# Patient Record
Sex: Female | Born: 1960
Health system: Southern US, Community
[De-identification: ages and names within clinical notes are randomized; demographics above are authoritative.]

## PROBLEM LIST (undated history)

## (undated) ENCOUNTER — Ambulatory Visit: Payer: BC Managed Care – PPO

## (undated) DIAGNOSIS — F419 Anxiety disorder, unspecified: Secondary | ICD-10-CM

## (undated) DIAGNOSIS — E785 Hyperlipidemia, unspecified: Secondary | ICD-10-CM

## (undated) DIAGNOSIS — I10 Essential (primary) hypertension: Secondary | ICD-10-CM

## (undated) DIAGNOSIS — M79643 Pain in unspecified hand: Secondary | ICD-10-CM

## (undated) DIAGNOSIS — E119 Type 2 diabetes mellitus without complications: Secondary | ICD-10-CM

## (undated) HISTORY — DX: Type 2 diabetes mellitus without complications: E11.9

## (undated) HISTORY — PX: TUBAL LIGATION: SHX77

## (undated) HISTORY — PX: TONSILLECTOMY: SUR1361

## (undated) HISTORY — PX: CYST EXCISION: SHX5701

## (undated) HISTORY — PX: LIPOMA EXCISION: SHX5283

---

## 2000-02-18 ENCOUNTER — Ambulatory Visit (HOSPITAL_COMMUNITY): Admission: RE | Admit: 2000-02-18 | Discharge: 2000-02-18 | Payer: Self-pay | Admitting: Obstetrics and Gynecology

## 2000-06-11 ENCOUNTER — Other Ambulatory Visit: Admission: RE | Admit: 2000-06-11 | Discharge: 2000-06-11 | Payer: Self-pay | Admitting: Obstetrics and Gynecology

## 2000-09-22 ENCOUNTER — Emergency Department (HOSPITAL_COMMUNITY): Admission: EM | Admit: 2000-09-22 | Discharge: 2000-09-22 | Payer: Self-pay | Admitting: Emergency Medicine

## 2000-09-22 ENCOUNTER — Encounter: Payer: Self-pay | Admitting: Emergency Medicine

## 2001-01-27 ENCOUNTER — Ambulatory Visit (HOSPITAL_COMMUNITY): Admission: RE | Admit: 2001-01-27 | Discharge: 2001-01-27 | Payer: Self-pay | Admitting: Family Medicine

## 2001-01-27 ENCOUNTER — Encounter: Payer: Self-pay | Admitting: Family Medicine

## 2001-01-29 ENCOUNTER — Ambulatory Visit (HOSPITAL_COMMUNITY): Admission: RE | Admit: 2001-01-29 | Discharge: 2001-01-29 | Payer: Self-pay | Admitting: Family Medicine

## 2001-01-29 ENCOUNTER — Encounter: Payer: Self-pay | Admitting: Family Medicine

## 2001-03-15 ENCOUNTER — Ambulatory Visit (HOSPITAL_COMMUNITY): Admission: RE | Admit: 2001-03-15 | Discharge: 2001-03-15 | Payer: Self-pay | Admitting: Thoracic Surgery

## 2001-03-15 ENCOUNTER — Encounter: Payer: Self-pay | Admitting: Thoracic Surgery

## 2001-03-17 HISTORY — PX: LUNG SURGERY: SHX703

## 2001-03-24 ENCOUNTER — Encounter: Payer: Self-pay | Admitting: Thoracic Surgery

## 2001-03-26 ENCOUNTER — Encounter: Payer: Self-pay | Admitting: Thoracic Surgery

## 2001-03-26 ENCOUNTER — Inpatient Hospital Stay (HOSPITAL_COMMUNITY): Admission: RE | Admit: 2001-03-26 | Discharge: 2001-03-30 | Payer: Self-pay | Admitting: Thoracic Surgery

## 2001-03-27 ENCOUNTER — Encounter: Payer: Self-pay | Admitting: Thoracic Surgery

## 2001-03-28 ENCOUNTER — Encounter: Payer: Self-pay | Admitting: Thoracic Surgery

## 2001-03-29 ENCOUNTER — Encounter: Payer: Self-pay | Admitting: Thoracic Surgery

## 2001-03-30 ENCOUNTER — Encounter: Payer: Self-pay | Admitting: Thoracic Surgery

## 2001-04-07 ENCOUNTER — Encounter: Payer: Self-pay | Admitting: Thoracic Surgery

## 2001-04-07 ENCOUNTER — Encounter: Admission: RE | Admit: 2001-04-07 | Discharge: 2001-04-07 | Payer: Self-pay | Admitting: Thoracic Surgery

## 2001-04-21 ENCOUNTER — Encounter: Payer: Self-pay | Admitting: Thoracic Surgery

## 2001-04-21 ENCOUNTER — Encounter: Admission: RE | Admit: 2001-04-21 | Discharge: 2001-04-21 | Payer: Self-pay | Admitting: Thoracic Surgery

## 2001-05-12 ENCOUNTER — Encounter: Admission: RE | Admit: 2001-05-12 | Discharge: 2001-05-12 | Payer: Self-pay | Admitting: Thoracic Surgery

## 2001-05-12 ENCOUNTER — Encounter: Payer: Self-pay | Admitting: Thoracic Surgery

## 2001-08-10 ENCOUNTER — Encounter: Payer: Self-pay | Admitting: Family Medicine

## 2001-08-10 ENCOUNTER — Ambulatory Visit (HOSPITAL_COMMUNITY): Admission: RE | Admit: 2001-08-10 | Discharge: 2001-08-10 | Payer: Self-pay | Admitting: Specialist

## 2001-08-11 ENCOUNTER — Encounter: Admission: RE | Admit: 2001-08-11 | Discharge: 2001-08-11 | Payer: Self-pay | Admitting: Thoracic Surgery

## 2001-08-11 ENCOUNTER — Encounter: Payer: Self-pay | Admitting: Thoracic Surgery

## 2001-11-10 ENCOUNTER — Encounter: Admission: RE | Admit: 2001-11-10 | Discharge: 2001-11-10 | Payer: Self-pay | Admitting: Thoracic Surgery

## 2001-11-10 ENCOUNTER — Encounter: Payer: Self-pay | Admitting: Thoracic Surgery

## 2004-09-19 ENCOUNTER — Encounter: Admission: RE | Admit: 2004-09-19 | Discharge: 2004-09-19 | Payer: Self-pay | Admitting: General Surgery

## 2015-05-02 ENCOUNTER — Ambulatory Visit
Admission: RE | Admit: 2015-05-02 | Discharge: 2015-05-02 | Disposition: A | Discharge status: Home / Self Care | Payer: BLUE CROSS/BLUE SHIELD | Source: Ambulatory Visit | Attending: Family Medicine | Admitting: Family Medicine

## 2015-05-02 ENCOUNTER — Other Ambulatory Visit: Payer: Self-pay | Admitting: Family Medicine

## 2015-05-02 DIAGNOSIS — M5432 Sciatica, left side: Secondary | ICD-10-CM

## 2015-11-08 ENCOUNTER — Other Ambulatory Visit: Payer: Self-pay | Admitting: Orthopedic Surgery

## 2015-11-08 DIAGNOSIS — M533 Sacrococcygeal disorders, not elsewhere classified: Secondary | ICD-10-CM

## 2016-11-06 ENCOUNTER — Other Ambulatory Visit: Payer: Self-pay | Admitting: Orthopedic Surgery

## 2016-11-06 DIAGNOSIS — M533 Sacrococcygeal disorders, not elsewhere classified: Secondary | ICD-10-CM

## 2016-11-27 ENCOUNTER — Telehealth: Payer: Self-pay | Admitting: *Deleted

## 2016-11-27 NOTE — Telephone Encounter (Signed)
Contacted Seth Bake at [place] office and left message on her machine. Left regarding the new patient appt for Septmebr 19th at 11:45am.

## 2016-12-01 ENCOUNTER — Other Ambulatory Visit: Payer: BLUE CROSS/BLUE SHIELD

## 2016-12-02 ENCOUNTER — Telehealth: Payer: Self-pay | Admitting: *Deleted

## 2016-12-02 ENCOUNTER — Other Ambulatory Visit: Payer: BLUE CROSS/BLUE SHIELD

## 2016-12-02 NOTE — Telephone Encounter (Signed)
Patient called and rescheduled her appt for later in the day tomorrow.

## 2016-12-03 ENCOUNTER — Encounter: Payer: Self-pay | Admitting: Gynecologic Oncology

## 2016-12-03 ENCOUNTER — Ambulatory Visit: Payer: BLUE CROSS/BLUE SHIELD | Attending: Gynecologic Oncology | Admitting: Gynecologic Oncology

## 2016-12-03 ENCOUNTER — Ambulatory Visit: Payer: BLUE CROSS/BLUE SHIELD | Admitting: Gynecologic Oncology

## 2016-12-03 VITALS — BP 142/76 | HR 74 | Temp 97.7°F | Resp 18 | Ht 65.0 in | Wt 233.0 lb

## 2016-12-03 DIAGNOSIS — Z8249 Family history of ischemic heart disease and other diseases of the circulatory system: Secondary | ICD-10-CM | POA: Insufficient documentation

## 2016-12-03 DIAGNOSIS — Z888 Allergy status to other drugs, medicaments and biological substances status: Secondary | ICD-10-CM | POA: Diagnosis not present

## 2016-12-03 DIAGNOSIS — Z79899 Other long term (current) drug therapy: Secondary | ICD-10-CM | POA: Insufficient documentation

## 2016-12-03 DIAGNOSIS — E119 Type 2 diabetes mellitus without complications: Secondary | ICD-10-CM | POA: Insufficient documentation

## 2016-12-03 DIAGNOSIS — N950 Postmenopausal bleeding: Secondary | ICD-10-CM | POA: Diagnosis not present

## 2016-12-03 DIAGNOSIS — E669 Obesity, unspecified: Secondary | ICD-10-CM | POA: Diagnosis not present

## 2016-12-03 DIAGNOSIS — Z992 Dependence on renal dialysis: Secondary | ICD-10-CM | POA: Insufficient documentation

## 2016-12-03 DIAGNOSIS — D259 Leiomyoma of uterus, unspecified: Secondary | ICD-10-CM | POA: Diagnosis not present

## 2016-12-03 DIAGNOSIS — C541 Malignant neoplasm of endometrium: Secondary | ICD-10-CM

## 2016-12-03 DIAGNOSIS — N930 Postcoital and contact bleeding: Secondary | ICD-10-CM | POA: Insufficient documentation

## 2016-12-03 DIAGNOSIS — Z9889 Other specified postprocedural states: Secondary | ICD-10-CM | POA: Insufficient documentation

## 2016-12-03 DIAGNOSIS — Z791 Long term (current) use of non-steroidal anti-inflammatories (NSAID): Secondary | ICD-10-CM | POA: Insufficient documentation

## 2016-12-03 DIAGNOSIS — Z87891 Personal history of nicotine dependence: Secondary | ICD-10-CM | POA: Insufficient documentation

## 2016-12-03 DIAGNOSIS — Z880 Allergy status to penicillin: Secondary | ICD-10-CM | POA: Insufficient documentation

## 2016-12-03 DIAGNOSIS — Z833 Family history of diabetes mellitus: Secondary | ICD-10-CM | POA: Diagnosis not present

## 2016-12-03 NOTE — Patient Instructions (Addendum)
Preparing for your Surgery  Plan for surgery on December 16, 2016 with Dr. Everitt Amber at [facility] will be scheduled for a robotic assisted total hysterectomy, bilateral salpingo-oophorectomy, sentinel lymph node biopsy.  Pre-operative Testing -You will receive a phone call from presurgical testing at [facility] to arrange for a pre-operative testing appointment before your surgery.  This appointment normally occurs one to two weeks before your scheduled surgery.   -Bring your insurance card, copy of an advanced directive if applicable, medication list  -At that visit, you will be asked to sign a consent for a possible blood transfusion in case a transfusion becomes necessary during surgery.  The need for a blood transfusion is rare but having consent is a necessary part of your care.     -You should not be taking blood thinners or aspirin at least ten days prior to surgery unless instructed by your surgeon.  Day Before Surgery at [place] will be asked to take in a light diet the day before surgery.  Avoid carbonated beverages.  You will be advised to have nothing to eat or drink after midnight the evening before.    Eat a light diet the day before surgery.  Examples including soups, broths, toast, yogurt, mashed potatoes.  Things to avoid include carbonated beverages (fizzy beverages), raw fruits and raw vegetables, or beans.   If your bowels are filled with gas, your surgeon will have difficulty visualizing your pelvic organs which increases your surgical risks.  Your role in recovery Your role is to become active as soon as directed by your doctor, while still giving yourself time to heal.  Rest when you feel tired. You will be asked to do the following in order to speed your recovery:  - Cough and breathe deeply. This helps toclear and expand your lungs and can prevent pneumonia. You may be given a spirometer to practice deep breathing. A staff  member will show you how to use the spirometer. - Do mild physical activity. Walking or moving your legs help your circulation and body functions return to normal. A staff member will help you when you try to walk and will provide you with simple exercises. Do not try to get up or walk alone the first time. - Actively manage your pain. Managing your pain lets you move in comfort. We will ask you to rate your pain on a scale of zero to 10. It is your responsibility to tell your doctor or nurse where and how much you hurt so your pain can be treated.  Special Considerations -If you are diabetic, you may be placed on insulin after surgery to have closer control over your blood sugars to promote healing and recovery.  This does not mean that you will be discharged on insulin.  If applicable, your oral antidiabetics will be resumed when you are tolerating a solid diet.  -Your final pathology results from surgery should be available by the Friday after surgery and the results will be relayed to you when available.  -Dr. Lahoma Crocker is the Surgeon that assists your GYN Oncologist with surgery.  The next day after your surgery you will either see your GYN Oncologist or Dr. Lahoma Crocker.   Blood Transfusion Information WHAT IS A BLOOD TRANSFUSION? A transfusion is the replacement of blood or some of its parts. Blood is made up of multiple cells which provide different functions.  Red blood cells carry oxygen and are used for blood  loss replacement.  White blood cells fight against infection.  Platelets control bleeding.  Plasma helps clot blood.  Other blood products are available for specialized needs, such as hemophilia or other clotting disorders. BEFORE THE TRANSFUSION  Who gives blood for transfusions?   You may be able to donate blood to be used at a later date on yourself (autologous donation).  Relatives can be asked to donate blood. This is generally not any safer than if  you have received blood from a stranger. The same precautions are taken to ensure safety when a relative's blood is donated.  Healthy volunteers who are fully evaluated to make sure their blood is safe. This is blood bank blood. Transfusion therapy is the safest it has ever been in the practice of medicine. Before blood is taken from a donor, a complete history is taken to make sure that person has no history of diseases nor engages in risky social behavior (examples are intravenous drug use or sexual activity with multiple partners). The donor's travel history is screened to minimize risk of transmitting infections, such as malaria. The donated blood is tested for signs of infectious diseases, such as HIV and hepatitis. The blood is then tested to be sure it is compatible with you in order to minimize the chance of a transfusion reaction. If you or a relative donates blood, this is often done in anticipation of surgery and is not appropriate for emergency situations. It takes many days to process the donated blood. RISKS AND COMPLICATIONS Although transfusion therapy is very safe and saves many lives, the main dangers of transfusion include:   Getting an infectious disease.  Developing a transfusion reaction. This is an allergic reaction to something in the blood you were given. Every precaution is taken to prevent this. The decision to have a blood transfusion has been considered carefully by your caregiver before blood is given. Blood is not given unless the benefits outweigh the risks.  Total Laparoscopic Hysterectomy, Care After Refer to this sheet in the next few weeks. These instructions provide you with information on caring for yourself after your procedure. Your health care provider may also give you more specific instructions. Your treatment has been planned according to current medical practices, but problems sometimes occur. Call your health care provider if you have any problems or  questions after your procedure. What can I expect after the procedure?  Pain and bruising at the incision sites. You will be given pain medicine to control it.  Menopausal symptoms such as hot flashes, night sweats, and insomnia if your ovaries were removed.  Sore throat from the breathing tube that was inserted during surgery. Follow these instructions at home:  Only take over-the-counter or prescription medicines for pain, discomfort, or fever as directed by your health care provider.  Do not take aspirin. It can cause bleeding.  Do not drive when taking pain medicine.  Follow your health care provider's advice regarding diet, exercise, lifting, driving, and general activities.  Resume your usual diet as directed and allowed.  Get plenty of rest and sleep.  Do not douche, use tampons, or have sexual intercourse for at least 6 weeks, or until your health care provider gives you permission.  Change your bandages (dressings) as directed by your health care provider.  Monitor your temperature and notify your health care provider of a fever.  Take showers instead of baths for 2-3 weeks.  Do not drink alcohol until your health care provider gives you permission.  If you develop constipation, you may take a mild laxative with your health care provider's permission. Bran foods may help with constipation problems. Drinking enough fluids to keep your urine clear or pale yellow may help as well.  Try to have someone home with you for 1-2 weeks to help around the house.  Keep all of your follow-up appointments as directed by your health care provider. Contact a health care provider if:  You have swelling, redness, or increasing pain around your incision sites.  You have pus coming from your incision.  You notice a bad smell coming from your incision.  Your incision breaks open.  You feel dizzy or lightheaded.  You have pain or bleeding when you urinate.  You have persistent  diarrhea.  You have persistent nausea and vomiting.  You have abnormal vaginal discharge.  You have a rash.  You have any type of abnormal reaction or develop an allergy to your medicine.  You have poor pain control with your prescribed medicine. Get help right away if:  You have chest pain or shortness of breath.  You have severe abdominal pain that is not relieved with pain medicine.  You have pain or swelling in your legs. This information is not intended to replace advice given to you by your health care provider. Make sure you discuss any questions you have with your health care provider. Document Released: 12/22/2012 Document Revised: 08/09/2015 Document Reviewed: 09/21/2012 Elsevier Interactive Patient Education  2017 [employer].

## 2016-12-03 NOTE — Progress Notes (Signed)
Consult Note: Gyn-Onc  Shelia Murray 56 y.o. female  CC:  Chief Complaint  Patient presents with  . Endometrial cancer [facility])    HPI: Patient is seen today in consultation at the request of Dr. Ronita Hipps. Primary physician Dr. Joetta Manners.  Patient is a 56 year old who presented for her routine GYN care and had a Pap smear that revealed atypical endometrial/glandular cells of undetermined significance. This then prompted the appropriate evaluation. She underwent a ultrasound on 11/21/2016. It revealed the uterus to be 7 x 4 x 4.3 cm. The endometrial stripe was 14.2 mm. There are multiple fibroids noted with the largest being subserosal at 1.3 x 1.1 cm. She subsequently underwent an endometrial biopsy on the same day as the ultrasound. It revealed a grade 1 endometrioid adenocarcinoma. She comes in today for evaluation of the above. She states that she has been menopausal since about 2014 2015. She has intermittently and sporadically had some postmenopausal bleeding. Occasionally she'll have some postcoital spotting. She never took any hormone replacement therapy. She never recalls having a prior abnormal Pap smear. She did have a D&C in her 55s but she is not sure what the indication for that was.  She's otherwise been in her usual state of health. She is up-to-date on all her routine health care maintenance. Her METs are greater than 4.  Review of Systems; Constitutional: She does complain of bloating. She has gained about 5 pounds in last month. She has lost 8 pounds early in the year and is frustrated by her weight gain. Skin: No rash Cardiovascular: No chest pain, shortness of breath, or edema  Pulmonary: No cough Gastro Intestinal: She does complain of abdominal bloating but no specific pain. Occasionally she's had some nausea which is improved with ginger. It is rare that she will have emesis. She does have some loose stools up to 3-4 per day. She believes this is related to medications.   Genitourinary: Vaginal bleeding as above  Current Meds:  Outpatient Encounter Prescriptions as of 12/03/2016  Medication Sig  . ibuprofen (ADVIL,MOTRIN) 800 MG tablet TK 1 T PO EVERY 8 HOURS PRN FOR PAIN WITH FOOD  . lisinopril (PRINIVIL,ZESTRIL) 20 MG tablet TK 1 T PO QAM  . methocarbamol (ROBAXIN) 500 MG tablet TK 1 T PO Q 6 H PRF SPASMS  . pravastatin (PRAVACHOL) 40 MG tablet TK 1 T PO ONCE A DAY  . SYNJARDY XR 12.07-998 MG TB24 TK 1 T PO BID WC  . TRULICITY 1.5 PP/5.0DT SOPN INJ 0.5 ML [place] ONCE A WEEK  . VESICARE 5 MG tablet TK 1 T PO QD   No facility-administered encounter medications on file as of 12/03/2016.     Allergy:  Allergies  Allergen Reactions  . Amoxicillin Nausea And Vomiting  . Ketamine     Social Hx:   Social History   Social History  . Marital status: Married    Spouse name: N/A  . Number of children: N/A  . Years of education: N/A   Occupational History  . Not on file.   Social History Main Topics  . Smoking status: Former Smoker    Packs/day: 0.25    Years: 20.00    Quit date: 12/03/2001  . Smokeless tobacco: Never Used  . Alcohol use Yes     Comment: Socially  . Drug use: No  . Sexual activity: Yes   Other Topics Concern  . Not on file   Social History Narrative  . No narrative on file  Past Surgical Hx:  Past Surgical History:  Procedure Laterality Date  . LUNG SURGERY Left 2003   Resection of lung nodules LLL    Past Medical Hx: History reviewed. No pertinent past medical history.  Oncology Hx:    Endometrial ca [facility])   11/21/2016 Initial Diagnosis    Endometrial ca (HCC)-grade 1 on endometrial biopsy       Family Hx:  Family History  Problem Relation Age of Onset  . Heart attack Mother   . Hypertension Mother   . Diabetes Maternal Aunt     Vitals:  Blood pressure (!) 142/76, pulse 74, temperature 97.7 F (36.5 C), temperature source Oral, resp. rate 18, height 5\' 5"  (1.651 m), weight 233 lb (105.7 kg), SpO2 99  %.  Physical Exam:  Well-nourished vulva female in no acute distress.  Neck: Supple, no lymphadenopathy, no thyromegaly.  Lungs: Clear to auscultation bilaterally.  Cardiac: Regular rate rhythm.  Abdomen: Obese, soft, nontender, nondistended. She has a well-healed Pfannenstiel skin incision.  Groins: No lymphadenopathy.  Extremity: No edema.  Pelvic: External genitalia within normal limits. Vagina slightly atrophic. The cervix is nulliparous. There's a physiologic discharge. There are no visible lesions. Bimanual examination is limited by habitus and voluntary guarding however the uterus does not appear to be appreciably enlarged. There are no adnexal masses. Rectal confirms.  Assessment/Plan: 56 year old with a clinical stage I grade 1 endometrioid adenocarcinoma. We discussed proceeding with a hysterectomy, bilateral salpingo-oophorectomy as treatment for her endometrial cancer. We reviewed the sentinel lymph node technique. Risks and benefits of sentinel lymph node biopsy were reviewed. We reviewed that we use ICG dye. The patient does not have an iodine allergy her known liver dysfunction. We will the false negative rate of 0.4% and that 3% of patients with metastatic disease will not have it detected by sentinel lymph node biopsy and endometrial cancer. We also discussed that there is a low rate of allergic reaction to the dye which is less than 1%. In the case of failed mapping which can occur up to 40% of the time, a bilateral lower unilateral lymphadenectomy will be performed. She'll benefits of sentinel lymph nodes including a higher detection rate for metastases due to ultra staging and a potential reduction of operative morbidity were discussed. We discussed the risk of lymphedema, nerve dysfunction and lymphocytic process which all potential risks of both the sentinel lymph node technique as well as with complete lymphadenectomy. Additional risks to the patient including the risk of  injury to surrounding organs, thromboembolic disease, infection and bleeding were discussed.  We discussed moving forward with robotic assisted hysterectomy, bilateral salpingo-oophorectomy, sentinel lymph node evaluation, and possible lymph node dissection. We also discussed possible laparotomy. As stated above, the risks of surgery were discussed including but not limited to injury to surrounding organs such as the bowel, bladder, blood vessels, ureters, and nerves. Bleeding which may require a blood transfusion. There is also risks associated with anesthesia. She was to clear understanding and wishes to move forward with surgery. She was given the surgical date of October 2. Initially she felt that that was too soon there has been encouraged her to have the surgery that day. That works for him as he is on hemodialysis and is on a Monday Wednesday Friday schedule.  Her questions as well as those of her husband were elicited in answer to their satisfaction. She had some confusion regarding our discussion of completion lymphadenectomy in the setting of her not mapping. We discussed this  such that she understood. I did discuss that if she did not map on one or both sides we will proceed with a hysterectomy and sent for frozen section before completion lymphadenectomy would be performed in the setting of deeply invasive disease. After showing her pictures and explained this again she understood. We discussed typical recovery in time out of work. She will have FMLA forms sent to [place].  We appreciate the opportunity to partner in the care of this very pleasant patient. Nancy Marus A., MD 12/03/2016, 2:48 PM

## 2016-12-05 ENCOUNTER — Telehealth: Payer: Self-pay | Admitting: Gynecologic Oncology

## 2016-12-05 NOTE — Telephone Encounter (Signed)
Left message for patient asking her to please call the office to discuss her questions about her upcoming surgery.

## 2016-12-09 NOTE — Patient Instructions (Signed)
Shelia Murray  12/09/2016   Your procedure is scheduled on: 12-16-16  Report to [facility]  Entrance Take [place]  elevators to 3rd floor to  [place] at 10:30AM.    Call this number if you have problems the morning of surgery [phone]    Remember: ONLY 1 PERSON MAY GO WITH YOU TO SHORT STAY TO GET  READY MORNING OF [employer].    Eat a light diet the day before surgery.  Examples including soups, broths, toast, yogurt, mashed potatoes.  Things to avoid include carbonated beverages (fizzy  beverages), raw fruits and raw vegetables, or beans.  If your bowels are filled with gas, your surgeon will have difficulty visualizing your pelvic organs  which increases your surgical risks. Do not eat food or drink liquids :After Midnight.     Take these medicines the morning of surgery with A SIP OF WATER: PRAVASTATIN(PRAVACHOL)                                You may not have any metal on your body including hair pins and              piercings  Do not wear jewelry, make-up, lotions, powders or perfumes, deodorant             Do not wear nail polish.  Do not shave  48 hours prior to surgery.             Do not bring valuables to the hospital. [place].  Contacts, dentures or bridgework may not be worn into surgery.  Leave suitcase in the car. After surgery it may be brought to your room.               Please read over the following fact sheets you were given: _____________________________________________________________________  How to Manage Your Diabetes Before and After Surgery  Why is it important to control my blood sugar before and after surgery? . Improving blood sugar levels before and after surgery helps healing and can limit problems. . A way of improving blood sugar control is eating a healthy diet by: o  Eating less sugar and carbohydrates o  Increasing activity/exercise o  Talking  with your doctor about reaching your blood sugar goals . High blood sugars (greater than 180 mg/dL) can raise your risk of infections and slow your recovery, so you will need to focus on controlling your diabetes during the weeks before surgery. . Make sure that the doctor who takes care of your diabetes knows about your planned surgery including the date and location.  How do I manage my blood sugar before surgery? . Check your blood sugar at least 4 times a day, starting 2 days before surgery, to make sure that the level is not too high or low. o Check your blood sugar the morning of your surgery when you wake up and every 2 hours until you get to the Short Stay unit. . If your blood sugar is less than 70 mg/dL, you will need to treat for low blood sugar: o Do not take insulin. o Treat a low blood sugar (less than 70 mg/dL) with  cup of clear juice (cranberry or apple), 4  glucose tablets, OR glucose gel. o Recheck blood sugar in 15 minutes after treatment (to make sure it is greater than 70 mg/dL). If your blood sugar is not greater than 70 mg/dL on recheck, call [phone] for further instructions. . Report your blood sugar to the short stay nurse when you get to Short Stay.  . If you are admitted to the hospital after surgery: o Your blood sugar will be checked by the staff and you will probably be given insulin after surgery (instead of oral diabetes medicines) to make sure you have good blood sugar levels. o The goal for blood sugar control after surgery is 80-180 mg/dL.   WHAT DO I DO ABOUT MY DIABETES MEDICATION?    Please ask your healthcare provider for instructions SYNJARDY diabetes medication management for surgery!!  . THE DAY BEFORE SURGERY, take  TRULICITY as normal       . THE MORNING OF SURGERY,  do not take other diabetes injectables, including Byetta (exenatide), Bydureon (exenatide ER), Victoza (liraglutide), or Trulicity (dulaglutide).   Patient  Signature:  Date:   Nurse Signature:  Date:   Reviewed and Endorsed by [facility] Patient Education Committee, August 2015    [facility] - Preparing for Surgery       Before surgery, you can play an important role.  Because skin is not sterile, your skin needs to be as free of germs as possible.  You can reduce the number of germs on your skin by washing with CHG (chlorahexidine gluconate) soap before surgery.  CHG is an antiseptic cleaner which kills germs and bonds with the skin to continue killing germs even after washing. Please DO NOT use if you have an allergy to CHG or antibacterial soaps.  If your skin becomes reddened/irritated stop using the CHG and inform your nurse when you arrive at Short Stay. Do not shave (including legs and underarms) for at least 48 hours prior to the first CHG shower.  You may shave your face/neck. Please follow these instructions carefully:  1.  Shower with CHG Soap the night before surgery and the  morning of Surgery.  2.  If you choose to wash your hair, wash your hair first as usual with your  normal  shampoo.  3.  After you shampoo, rinse your hair and body thoroughly to remove the  shampoo.                           4.  Use CHG as you would any other liquid soap.  You can apply chg directly  to the skin and wash                       Gently with a scrungie or clean washcloth.  5.  Apply the CHG Soap to your body ONLY FROM THE NECK DOWN.   Do not use on face/ open                           Wound or open sores. Avoid contact with eyes, ears mouth and genitals (private parts).                       Wash face,  Genitals (private parts) with your normal soap.             6.  Wash thoroughly, paying special attention to the area where  your surgery  will be performed.  7.  Thoroughly rinse your body with warm water from the neck down.  8.  DO NOT shower/wash with your normal soap after using and rinsing off  the CHG Soap.                9.  Pat yourself dry  with a clean towel.            10.  Wear clean pajamas.            11.  Place clean sheets on your bed the night of your first shower and do not  sleep with pets. Day of Surgery : Do not apply any lotions/deodorants the morning of surgery.  Please wear clean clothes to the hospital/surgery center.  FAILURE TO FOLLOW THESE INSTRUCTIONS MAY RESULT IN THE CANCELLATION OF YOUR SURGERY PATIENT SIGNATURE_________________________________  NURSE SIGNATURE__________________________________  ________________________________________________________________________  WHAT IS A BLOOD TRANSFUSION? Blood Transfusion Information  A transfusion is the replacement of blood or some of its parts. Blood is made up of multiple cells which provide different functions.  Red blood cells carry oxygen and are used for blood loss replacement.  White blood cells fight against infection.  Platelets control bleeding.  Plasma helps clot blood.  Other blood products are available for specialized needs, such as hemophilia or other clotting disorders. BEFORE THE TRANSFUSION  Who gives blood for transfusions?   Healthy volunteers who are fully evaluated to make sure their blood is safe. This is blood bank blood. Transfusion therapy is the safest it has ever been in the practice of medicine. Before blood is taken from a donor, a complete history is taken to make sure that person has no history of diseases nor engages in risky social behavior (examples are intravenous drug use or sexual activity with multiple partners). The donor's travel history is screened to minimize risk of transmitting infections, such as malaria. The donated blood is tested for signs of infectious diseases, such as HIV and hepatitis. The blood is then tested to be sure it is compatible with you in order to minimize the chance of a transfusion reaction. If you or a relative donates blood, this is often done in anticipation of surgery and is not  appropriate for emergency situations. It takes many days to process the donated blood. RISKS AND COMPLICATIONS Although transfusion therapy is very safe and saves many lives, the main dangers of transfusion include:   Getting an infectious disease.  Developing a transfusion reaction. This is an allergic reaction to something in the blood you were given. Every precaution is taken to prevent this. The decision to have a blood transfusion has been considered carefully by your caregiver before blood is given. Blood is not given unless the benefits outweigh the risks. AFTER THE TRANSFUSION  Right after receiving a blood transfusion, you will usually feel much better and more energetic. This is especially true if your red blood cells have gotten low (anemic). The transfusion raises the level of the red blood cells which carry oxygen, and this usually causes an energy increase.  The nurse administering the transfusion will monitor you carefully for complications. HOME CARE INSTRUCTIONS  No special instructions are needed after a transfusion. You may find your energy is better. Speak with your caregiver about any limitations on activity for underlying diseases you may have. SEEK MEDICAL CARE IF:   Your condition is not improving after your transfusion.  You develop redness or irritation at the intravenous (IV)  site. SEEK IMMEDIATE MEDICAL CARE IF:  Any of the following symptoms occur over the next 12 hours:  Shaking chills.  You have a temperature by mouth above 102 F (38.9 C), not controlled by medicine.  Chest, back, or muscle pain.  People around you feel you are not acting correctly or are confused.  Shortness of breath or difficulty breathing.  Dizziness and fainting.  You get a rash or develop hives.  You have a decrease in urine output.  Your urine turns a dark color or changes to pink, red, or brown. Any of the following symptoms occur over the next 10 days:  You have a  temperature by mouth above 102 F (38.9 C), not controlled by medicine.  Shortness of breath.  Weakness after normal activity.  The white part of the eye turns yellow (jaundice).  You have a decrease in the amount of urine or are urinating less often.  Your urine turns a dark color or changes to pink, red, or brown. Document Released: 02/29/2000 Document Revised: 05/26/2011 Document Reviewed: 10/18/2007 [facility] Patient Information 2014 [place], [place].  _______________________________________________________________________

## 2016-12-12 ENCOUNTER — Encounter (HOSPITAL_COMMUNITY)
Admission: RE | Admit: 2016-12-12 | Discharge: 2016-12-12 | Disposition: A | Discharge status: Home / Self Care | Payer: BLUE CROSS/BLUE SHIELD | Source: Ambulatory Visit | Attending: Gynecologic Oncology | Admitting: Gynecologic Oncology

## 2016-12-12 ENCOUNTER — Encounter (HOSPITAL_COMMUNITY): Payer: Self-pay | Admitting: Emergency Medicine

## 2016-12-12 DIAGNOSIS — C541 Malignant neoplasm of endometrium: Secondary | ICD-10-CM | POA: Insufficient documentation

## 2016-12-12 DIAGNOSIS — Z01812 Encounter for preprocedural laboratory examination: Secondary | ICD-10-CM | POA: Insufficient documentation

## 2016-12-12 HISTORY — DX: Pain in unspecified hand: M79.643

## 2016-12-12 HISTORY — DX: Hyperlipidemia, unspecified: E78.5

## 2016-12-12 HISTORY — DX: Essential (primary) hypertension: I10

## 2016-12-12 LAB — CBC
HEMATOCRIT: 41.6 % (ref 36.0–46.0)
Hemoglobin: 13.7 g/dL (ref 12.0–15.0)
MCH: 27.6 pg (ref 26.0–34.0)
MCHC: 32.9 g/dL (ref 30.0–36.0)
MCV: 83.7 fL (ref 78.0–100.0)
PLATELETS: 256 10*3/uL (ref 150–400)
RBC: 4.97 MIL/uL (ref 3.87–5.11)
RDW: 15.2 % (ref 11.5–15.5)
WBC: 7.9 10*3/uL (ref 4.0–10.5)

## 2016-12-12 LAB — COMPREHENSIVE METABOLIC PANEL
ALT: 20 U/L (ref 14–54)
AST: 23 U/L (ref 15–41)
Albumin: 4 g/dL (ref 3.5–5.0)
Alkaline Phosphatase: 104 U/L (ref 38–126)
Anion gap: 8 (ref 5–15)
BILIRUBIN TOTAL: 0.6 mg/dL (ref 0.3–1.2)
BUN: 8 mg/dL (ref 6–20)
CALCIUM: 9.2 mg/dL (ref 8.9–10.3)
CHLORIDE: 107 mmol/L (ref 101–111)
CO2: 27 mmol/L (ref 22–32)
CREATININE: 0.61 mg/dL (ref 0.44–1.00)
Glucose, Bld: 100 mg/dL — ABNORMAL HIGH (ref 65–99)
Potassium: 4.2 mmol/L (ref 3.5–5.1)
Sodium: 142 mmol/L (ref 135–145)
TOTAL PROTEIN: 7.2 g/dL (ref 6.5–8.1)

## 2016-12-12 LAB — URINALYSIS, ROUTINE W REFLEX MICROSCOPIC
BACTERIA UA: NONE SEEN
Bilirubin Urine: NEGATIVE
Glucose, UA: >=500 mg/dL — AB
Hgb urine dipstick: NEGATIVE
Ketones, ur: NEGATIVE mg/dL
Leukocytes, UA: NEGATIVE
Nitrite: NEGATIVE
PROTEIN: NEGATIVE mg/dL
RBC / HPF: NONE SEEN RBC/hpf (ref 0–5)
SPECIFIC GRAVITY, URINE: 1.021 (ref 1.005–1.030)
pH: 5 (ref 5.0–8.0)

## 2016-12-12 LAB — GLUCOSE, CAPILLARY: Glucose-Capillary: 108 mg/dL — ABNORMAL HIGH (ref 65–99)

## 2016-12-12 LAB — ABO/RH: ABO/RH(D): A NEG

## 2016-12-12 NOTE — Progress Notes (Signed)
Patient is known diabetic, abnormal Urinalysis result routed via epic to surgeon, Dr Denman George  and PCP

## 2016-12-16 ENCOUNTER — Ambulatory Visit (HOSPITAL_COMMUNITY): Payer: BLUE CROSS/BLUE SHIELD | Admitting: Certified Registered Nurse Anesthetist

## 2016-12-16 ENCOUNTER — Encounter (HOSPITAL_COMMUNITY)
Admission: RE | Disposition: A | Discharge status: Home / Self Care | Payer: Self-pay | Source: Ambulatory Visit | Attending: Gynecologic Oncology

## 2016-12-16 ENCOUNTER — Ambulatory Visit (HOSPITAL_COMMUNITY)
Admission: RE | Admit: 2016-12-16 | Discharge: 2016-12-17 | Disposition: A | Discharge status: Home / Self Care | Payer: BLUE CROSS/BLUE SHIELD | Source: Ambulatory Visit | Attending: Gynecologic Oncology | Admitting: Gynecologic Oncology

## 2016-12-16 ENCOUNTER — Encounter (HOSPITAL_COMMUNITY): Payer: Self-pay | Admitting: *Deleted

## 2016-12-16 ENCOUNTER — Telehealth: Payer: Self-pay | Admitting: *Deleted

## 2016-12-16 DIAGNOSIS — I10 Essential (primary) hypertension: Secondary | ICD-10-CM | POA: Diagnosis not present

## 2016-12-16 DIAGNOSIS — N838 Other noninflammatory disorders of ovary, fallopian tube and broad ligament: Secondary | ICD-10-CM | POA: Diagnosis not present

## 2016-12-16 DIAGNOSIS — E119 Type 2 diabetes mellitus without complications: Secondary | ICD-10-CM | POA: Insufficient documentation

## 2016-12-16 DIAGNOSIS — Z6838 Body mass index (BMI) 38.0-38.9, adult: Secondary | ICD-10-CM | POA: Diagnosis not present

## 2016-12-16 DIAGNOSIS — Z79899 Other long term (current) drug therapy: Secondary | ICD-10-CM | POA: Insufficient documentation

## 2016-12-16 DIAGNOSIS — D259 Leiomyoma of uterus, unspecified: Secondary | ICD-10-CM | POA: Insufficient documentation

## 2016-12-16 DIAGNOSIS — Z87891 Personal history of nicotine dependence: Secondary | ICD-10-CM | POA: Diagnosis not present

## 2016-12-16 DIAGNOSIS — Z7984 Long term (current) use of oral hypoglycemic drugs: Secondary | ICD-10-CM | POA: Insufficient documentation

## 2016-12-16 DIAGNOSIS — C541 Malignant neoplasm of endometrium: Secondary | ICD-10-CM | POA: Diagnosis present

## 2016-12-16 DIAGNOSIS — Z992 Dependence on renal dialysis: Secondary | ICD-10-CM | POA: Diagnosis not present

## 2016-12-16 HISTORY — PX: ROBOTIC ASSISTED TOTAL HYSTERECTOMY WITH BILATERAL SALPINGO OOPHERECTOMY: SHX6086

## 2016-12-16 HISTORY — PX: SENTINEL NODE BIOPSY: SHX6608

## 2016-12-16 LAB — TYPE AND SCREEN
ABO/RH(D): A NEG
ANTIBODY SCREEN: NEGATIVE

## 2016-12-16 LAB — GLUCOSE, CAPILLARY
GLUCOSE-CAPILLARY: 84 mg/dL (ref 65–99)
Glucose-Capillary: 147 mg/dL — ABNORMAL HIGH (ref 65–99)

## 2016-12-16 SURGERY — ROBOTIC ASSISTED TOTAL HYSTERECTOMY WITH BILATERAL SALPINGO OOPHORECTOMY
Anesthesia: General | Laterality: Bilateral

## 2016-12-16 MED ORDER — ROCURONIUM BROMIDE 10 MG/ML (PF) SYRINGE
PREFILLED_SYRINGE | INTRAVENOUS | Status: DC | PRN
  Administered 2016-12-16: 10 mg via INTRAVENOUS
  Administered 2016-12-16: 50 mg via INTRAVENOUS

## 2016-12-16 MED ORDER — LIDOCAINE 2% (20 MG/ML) 5 ML SYRINGE
INTRAMUSCULAR | Status: DC | PRN
  Administered 2016-12-16: 60 mg via INTRAVENOUS

## 2016-12-16 MED ORDER — MIDAZOLAM HCL 2 MG/2ML IJ SOLN
INTRAMUSCULAR | Status: AC
  Filled 2016-12-16: qty 2

## 2016-12-16 MED ORDER — DEXAMETHASONE SODIUM PHOSPHATE 10 MG/ML IJ SOLN
INTRAMUSCULAR | Status: DC | PRN
  Administered 2016-12-16: 10 mg via INTRAVENOUS

## 2016-12-16 MED ORDER — MIDAZOLAM HCL 5 MG/5ML IJ SOLN
INTRAMUSCULAR | Status: DC | PRN
  Administered 2016-12-16: 2 mg via INTRAVENOUS

## 2016-12-16 MED ORDER — SENNOSIDES-DOCUSATE SODIUM 8.6-50 MG PO TABS
2.0000 | ORAL_TABLET | Freq: Every day | ORAL | Status: DC
  Administered 2016-12-16: 2 via ORAL
  Filled 2016-12-16: qty 2

## 2016-12-16 MED ORDER — GABAPENTIN 300 MG PO CAPS
600.0000 mg | ORAL_CAPSULE | Freq: Every day | ORAL | Status: AC
  Administered 2016-12-16: 600 mg via ORAL
  Filled 2016-12-16: qty 2

## 2016-12-16 MED ORDER — ONDANSETRON HCL 4 MG PO TABS: 4.0000 mg | ORAL_TABLET | Freq: Four times a day (QID) | ORAL | Status: DC | PRN

## 2016-12-16 MED ORDER — PROPOFOL 10 MG/ML IV BOLUS
INTRAVENOUS | Status: DC | PRN
  Administered 2016-12-16: 180 mg via INTRAVENOUS

## 2016-12-16 MED ORDER — STERILE WATER FOR IRRIGATION IR SOLN
Status: DC | PRN
  Administered 2016-12-16: 1000 mL

## 2016-12-16 MED ORDER — STERILE WATER FOR INJECTION IJ SOLN
INTRAMUSCULAR | Status: AC
  Filled 2016-12-16: qty 10

## 2016-12-16 MED ORDER — PRAVASTATIN SODIUM 20 MG PO TABS
40.0000 mg | ORAL_TABLET | Freq: Every evening | ORAL | Status: DC
  Administered 2016-12-16: 40 mg via ORAL
  Filled 2016-12-16: qty 2

## 2016-12-16 MED ORDER — KETOROLAC TROMETHAMINE 30 MG/ML IJ SOLN
INTRAMUSCULAR | Status: AC
  Filled 2016-12-16: qty 1

## 2016-12-16 MED ORDER — INSULIN ASPART 100 UNIT/ML ~~LOC~~ SOLN
0.0000 [IU] | Freq: Three times a day (TID) | SUBCUTANEOUS | Status: DC
  Administered 2016-12-17 (×2): 2 [IU] via SUBCUTANEOUS

## 2016-12-16 MED ORDER — HYDROMORPHONE HCL-NACL 0.5-0.9 MG/ML-% IV SOSY
PREFILLED_SYRINGE | INTRAVENOUS | Status: AC
  Administered 2016-12-16: 0.5 mg via INTRAVENOUS
  Filled 2016-12-16: qty 2

## 2016-12-16 MED ORDER — HYDROMORPHONE HCL 1 MG/ML IJ SOLN: 0.2000 mg | INTRAMUSCULAR | Status: DC | PRN

## 2016-12-16 MED ORDER — KCL IN DEXTROSE-NACL 20-5-0.45 MEQ/L-%-% IV SOLN
INTRAVENOUS | Status: DC
  Administered 2016-12-16: 18:00:00 via INTRAVENOUS
  Filled 2016-12-16 (×2): qty 1000

## 2016-12-16 MED ORDER — ENOXAPARIN SODIUM 40 MG/0.4ML ~~LOC~~ SOLN
40.0000 mg | SUBCUTANEOUS | Status: DC
  Administered 2016-12-17: 40 mg via SUBCUTANEOUS
  Filled 2016-12-16: qty 0.4

## 2016-12-16 MED ORDER — LISINOPRIL 20 MG PO TABS
20.0000 mg | ORAL_TABLET | Freq: Every day | ORAL | Status: DC
  Administered 2016-12-17: 20 mg via ORAL
  Filled 2016-12-16: qty 1

## 2016-12-16 MED ORDER — KETOROLAC TROMETHAMINE 30 MG/ML IJ SOLN
INTRAMUSCULAR | Status: AC
  Administered 2016-12-16: 30 mg
  Filled 2016-12-16: qty 1

## 2016-12-16 MED ORDER — ENOXAPARIN SODIUM 40 MG/0.4ML ~~LOC~~ SOLN
40.0000 mg | SUBCUTANEOUS | Status: AC
  Administered 2016-12-16: 40 mg via SUBCUTANEOUS
  Filled 2016-12-16: qty 0.4

## 2016-12-16 MED ORDER — HYDROMORPHONE HCL-NACL 0.5-0.9 MG/ML-% IV SOSY
0.2500 mg | PREFILLED_SYRINGE | INTRAVENOUS | Status: DC | PRN
  Administered 2016-12-16: 0.5 mg via INTRAVENOUS

## 2016-12-16 MED ORDER — PROMETHAZINE HCL 25 MG/ML IJ SOLN
6.2500 mg | INTRAMUSCULAR | Status: DC | PRN
  Administered 2016-12-16: 6.25 mg via INTRAVENOUS

## 2016-12-16 MED ORDER — TRAMADOL HCL 50 MG PO TABS
100.0000 mg | ORAL_TABLET | Freq: Four times a day (QID) | ORAL | Status: DC | PRN
  Administered 2016-12-16 – 2016-12-17 (×2): 100 mg via ORAL
  Filled 2016-12-16 (×2): qty 2

## 2016-12-16 MED ORDER — PROMETHAZINE HCL 25 MG/ML IJ SOLN
INTRAMUSCULAR | Status: AC
Start: 2016-12-16 — End: 2016-12-16
  Administered 2016-12-16: 6.25 mg via INTRAVENOUS
  Filled 2016-12-16: qty 1

## 2016-12-16 MED ORDER — SUCCINYLCHOLINE CHLORIDE 20 MG/ML IJ SOLN
INTRAMUSCULAR | Status: DC | PRN
  Administered 2016-12-16: 120 mg via INTRAVENOUS

## 2016-12-16 MED ORDER — INSULIN GLARGINE 100 UNIT/ML ~~LOC~~ SOLN
20.0000 [IU] | Freq: Every day | SUBCUTANEOUS | Status: DC
  Administered 2016-12-16: 20 [IU] via SUBCUTANEOUS
  Filled 2016-12-16 (×2): qty 0.2

## 2016-12-16 MED ORDER — LACTATED RINGERS IV SOLN
INTRAVENOUS | Status: DC
  Administered 2016-12-16 (×2): via INTRAVENOUS

## 2016-12-16 MED ORDER — CEFAZOLIN SODIUM-DEXTROSE 2-4 GM/100ML-% IV SOLN
2.0000 g | INTRAVENOUS | Status: AC
  Administered 2016-12-16: 2 g via INTRAVENOUS
  Filled 2016-12-16: qty 100

## 2016-12-16 MED ORDER — ONDANSETRON HCL 4 MG/2ML IJ SOLN
INTRAMUSCULAR | Status: DC | PRN
  Administered 2016-12-16: 4 mg via INTRAVENOUS

## 2016-12-16 MED ORDER — STERILE WATER FOR INJECTION IJ SOLN
INTRAMUSCULAR | Status: DC | PRN
  Administered 2016-12-16: 10 mL

## 2016-12-16 MED ORDER — OXYCODONE-ACETAMINOPHEN 5-325 MG PO TABS: 1.0000 | ORAL_TABLET | ORAL | Status: DC | PRN

## 2016-12-16 MED ORDER — SUGAMMADEX SODIUM 200 MG/2ML IV SOLN
INTRAVENOUS | Status: DC | PRN
  Administered 2016-12-16: 200 mg via INTRAVENOUS

## 2016-12-16 MED ORDER — FENTANYL CITRATE (PF) 250 MCG/5ML IJ SOLN
INTRAMUSCULAR | Status: AC
  Filled 2016-12-16: qty 5

## 2016-12-16 MED ORDER — IBUPROFEN 800 MG PO TABS: 800.0000 mg | ORAL_TABLET | Freq: Three times a day (TID) | ORAL | Status: DC | PRN

## 2016-12-16 MED ORDER — FENTANYL CITRATE (PF) 100 MCG/2ML IJ SOLN
INTRAMUSCULAR | Status: DC | PRN
  Administered 2016-12-16 (×3): 50 ug via INTRAVENOUS
  Administered 2016-12-16: 100 ug via INTRAVENOUS

## 2016-12-16 MED ORDER — ONDANSETRON HCL 4 MG/2ML IJ SOLN
4.0000 mg | Freq: Four times a day (QID) | INTRAMUSCULAR | Status: DC | PRN
  Administered 2016-12-17: 4 mg via INTRAVENOUS
  Filled 2016-12-16: qty 2

## 2016-12-16 SURGICAL SUPPLY — 52 items
ADH SKN CLS APL DERMABOND .7 (GAUZE/BANDAGES/DRESSINGS) ×1
AGENT HMST KT MTR STRL THRMB (HEMOSTASIS)
APL ESCP 34 STRL LF DISP (HEMOSTASIS)
APPLICATOR SURGIFLO ENDO (HEMOSTASIS) IMPLANT
BAG LAPAROSCOPIC 12 15 PORT 16 (BASKET) IMPLANT
BAG RETRIEVAL 12/15 (BASKET)
BAG SPEC RTRVL LRG 6X4 10 (ENDOMECHANICALS)
COVER BACK TABLE 60X90IN (DRAPES) ×2 IMPLANT
COVER TIP SHEARS 8 DVNC (MISCELLANEOUS) ×1 IMPLANT
COVER TIP SHEARS 8MM DA VINCI (MISCELLANEOUS) ×1
DERMABOND ADVANCED (GAUZE/BANDAGES/DRESSINGS) ×1
DERMABOND ADVANCED .7 DNX12 (GAUZE/BANDAGES/DRESSINGS) IMPLANT
DRAPE ARM DVNC X/XI (DISPOSABLE) ×4 IMPLANT
DRAPE COLUMN DVNC XI (DISPOSABLE) ×1 IMPLANT
DRAPE DA VINCI XI ARM (DISPOSABLE) ×4
DRAPE DA VINCI XI COLUMN (DISPOSABLE) ×1
DRAPE SHEET LG 3/4 BI-LAMINATE (DRAPES) ×2 IMPLANT
DRAPE SURG IRRIG POUCH 19X23 (DRAPES) ×2 IMPLANT
ELECT REM PT RETURN 15FT ADLT (MISCELLANEOUS) ×2 IMPLANT
GLOVE BIO SURGEON STRL SZ 6 (GLOVE) ×8 IMPLANT
GLOVE BIO SURGEON STRL SZ 6.5 (GLOVE) ×4 IMPLANT
GOWN STRL REUS W/ TWL LRG LVL3 (GOWN DISPOSABLE) ×2 IMPLANT
GOWN STRL REUS W/TWL LRG LVL3 (GOWN DISPOSABLE) ×6
HOLDER FOLEY CATH W/STRAP (MISCELLANEOUS) ×2 IMPLANT
IRRIG SUCT STRYKERFLOW 2 WTIP (MISCELLANEOUS) ×2
IRRIGATION SUCT STRKRFLW 2 WTP (MISCELLANEOUS) ×1 IMPLANT
KIT PROCEDURE DA VINCI SI (MISCELLANEOUS) ×1
KIT PROCEDURE DVNC SI (MISCELLANEOUS) IMPLANT
MANIPULATOR UTERINE 4.5 ZUMI (MISCELLANEOUS) ×2 IMPLANT
NDL SAFETY ECLIPSE 18X1.5 (NEEDLE) ×1 IMPLANT
NDL SPNL 18GX3.5 QUINCKE PK (NEEDLE) ×1 IMPLANT
NEEDLE HYPO 18GX1.5 SHARP (NEEDLE) ×2
NEEDLE SPNL 18GX3.5 QUINCKE PK (NEEDLE) ×2 IMPLANT
OBTURATOR OPTICAL STANDARD 8MM (TROCAR) ×1
OBTURATOR OPTICAL STND 8 DVNC (TROCAR) ×1
OBTURATOR OPTICALSTD 8 DVNC (TROCAR) ×1 IMPLANT
PACK ROBOT GYN CUSTOM WL (TRAY / TRAY PROCEDURE) ×2 IMPLANT
PAD POSITIONING PINK XL (MISCELLANEOUS) ×2 IMPLANT
POUCH SPECIMEN RETRIEVAL 10MM (ENDOMECHANICALS) IMPLANT
SEAL CANN UNIV 5-8 DVNC XI (MISCELLANEOUS) ×4 IMPLANT
SEAL XI 5MM-8MM UNIVERSAL (MISCELLANEOUS) ×4
SET TRI-LUMEN FLTR TB AIRSEAL (TUBING) ×2 IMPLANT
SOLUTION ELECTROLUBE (MISCELLANEOUS) ×1 IMPLANT
SURGIFLO W/THROMBIN 8M KIT (HEMOSTASIS) IMPLANT
SUT VIC AB 0 CT1 27 (SUTURE) ×2
SUT VIC AB 0 CT1 27XBRD ANTBC (SUTURE) IMPLANT
SYR 10ML LL (SYRINGE) ×2 IMPLANT
TOWEL OR NON WOVEN STRL DISP B (DISPOSABLE) ×2 IMPLANT
TRAP SPECIMEN MUCOUS 40CC (MISCELLANEOUS) IMPLANT
TRAY FOLEY W/METER SILVER 16FR (SET/KITS/TRAYS/PACK) ×2 IMPLANT
UNDERPAD 30X30 (UNDERPADS AND DIAPERS) ×3 IMPLANT
WATER STERILE IRR 1000ML POUR (IV SOLUTION) ×2 IMPLANT

## 2016-12-16 NOTE — Transfer of Care (Signed)
Immediate Anesthesia Transfer of Care Note  Patient: Shelia Murray  Procedure(s) Performed: ROBOTIC ASSISTED TOTAL HYSTERECTOMY WITH BILATERAL SALPINGO OOPHORECTOMY (Bilateral ) SENTINEL NODE BIOPSY (Bilateral )  Patient Location: PACU  Anesthesia Type:General  Level of Consciousness: sedated, patient cooperative and responds to stimulation  Airway & Oxygen Therapy: Patient Spontanous Breathing and Patient connected to face mask oxygen  Post-op Assessment: Report given to RN and Post -op Vital signs reviewed and stable  Post vital signs: Reviewed and stable  Last Vitals:  Vitals:   12/16/16 1025 12/16/16 1452  BP: (!) 160/83   Pulse: 71 (P) 87  Resp: 16 (P) 17  Temp: (!) 36.4 C (P) 36.7 C  SpO2: 100% (P) 100%    Last Pain:  Vitals:   12/16/16 1130  TempSrc:   PainSc: 5       Patients Stated Pain Goal: 5 (22/97/98 9211)  Complications: No apparent anesthesia complications

## 2016-12-16 NOTE — Op Note (Signed)
OPERATIVE NOTE 12/16/16  Surgeon: [name]   Assistants: Dr Lahoma Crocker (an MD assistant was necessary for tissue manipulation, management of robotic instrumentation, retraction and positioning due to the complexity of the case and hospital policies).   Anesthesia: General endotracheal anesthesia  ASA Class: 3   Pre-operative Diagnosis: endometrial cancer grade 1  Post-operative Diagnosis: same  Operation: Robotic-assisted laparoscopic total hysterectomy with bilateral salpingoophorectomy, SLN biopsy   Surgeon: [name]  Assistant Surgeon: Lahoma Crocker MD  Anesthesia: GET  Urine Output: 100  Operative Findings:  : extreme abdominal obesity, 8cm fibroid uterus, no suspicious nodes.  Estimated Blood Loss:  less than 100 mL      Total IV Fluids: 900 ml         Specimens: uterus, cervix, bilateral tubes and ovaries, right obturator and external iliac SLN, left obturator SLN         Complications:  None; patient tolerated the procedure well.         Disposition: PACU - hemodynamically stable.  Procedure Details  The patient was seen in the Holding Room. The risks, benefits, complications, treatment options, and expected outcomes were discussed with the patient.  The patient concurred with the proposed plan, giving informed consent.  The site of surgery properly noted/marked. The patient was identified as Shelia Murray and the procedure verified as a Robotic-assisted hysterectomy with bilateral salpingo oophorectomy with SLN biopsy. A Time Out was held and the above information confirmed.  After induction of anesthesia, the patient was draped and prepped in the usual sterile manner. Pt was placed in supine position after anesthesia and draped and prepped in the usual sterile manner. The abdominal drape was placed after the CholoraPrep had been allowed to dry for 3 minutes.  Her arms were tucked to her side with all appropriate precautions.  The  shoulders were stabilized with padded shoulder blocks applied to the acromium processes.  The patient was placed in the semi-lithotomy position in [place].  The perineum was prepped with Betadine. The patient was then prepped. Foley catheter was placed.  A sterile speculum was placed in the vagina.  The cervix was grasped with a single-tooth tenaculum. 2mg  total of ICG was injected into the cervical stroma at 2 and 9 o'clock with 1cc injected at a 1cm and 44mm depth (concentration 0.5mg /ml) in all locations. The cervix was dilated with Kennon Rounds dilators.  The ZUMI uterine manipulator with a medium colpotomizer ring was placed without difficulty.  A pneum occluder balloon was placed over the manipulator.  OG tube placement was confirmed and to suction.   Next, a 5 mm skin incision was made 1 cm below the subcostal margin in the midclavicular line.  The 5 mm Optiview port and scope was used for direct entry.  Opening pressure was under 10 mm CO2.  The abdomen was insufflated and the findings were noted as above.   At this point and all points during the procedure, the patient's intra-abdominal pressure did not exceed 15 mmHg. Next, a 10 mm skin incision was made in the umbilicus and a right and left port was placed about 10 cm lateral to the robot port on the right and left side.  A fourth arm was placed in the left lower quadrant 2 cm above and superior and medial to the anterior superior iliac spine.  All ports were placed under direct visualization.  The patient was placed in steep Trendelenburg.  Bowel was folded away into the upper abdomen.  The robot was docked in the normal manner.  The right and left peritoneum were opened parallel to the IP ligament to open the retroperitoneal spaces bilaterally. The SLN mapping was performed in bilateral pelvic basins. The para rectal and paravesical spaces were opened up entirely with careful dissection below the level of the ureters bilaterally and to the depth of  the uterine artery origin in order to skeletonize the uterine "web" and ensure visualization of all parametrial channels. The para-aortic basins were carefully exposed and evaluated for isolated para-aortic SLN's. Lymphatic channels were identified travelling to the following visualized sentinel lymph node's: right obturator and external iliac SLN and left obturator SLn. These SLN's were separated from their surrounding lymphatic tissue, removed and sent for permanent pathology.  The hysterectomy was started after the round ligament on the right side was incised and the retroperitoneum was entered and the pararectal space was developed.  The ureter was noted to be on the medial leaf of the broad ligament.  The peritoneum above the ureter was incised and stretched and the infundibulopelvic ligament was skeletonized, cauterized and cut.  The posterior peritoneum was taken down to the level of the KOH ring.  The anterior peritoneum was also taken down.  The bladder flap was created to the level of the KOH ring.  The uterine artery on the right side was skeletonized, cauterized and cut in the normal manner.  A similar procedure was performed on the left.  The colpotomy was made and the uterus, cervix, bilateral ovaries and tubes were amputated and delivered through the vagina.  Pedicles were inspected and excellent hemostasis was achieved.    The colpotomy at the vaginal cuff was closed with Vicryl on a CT1 needle in a running manner.  Irrigation was used and excellent hemostasis was achieved.  At this point in the procedure was completed.  Robotic instruments were removed under direct visulaization.  The robot was undocked. The 10 mm ports were closed with Vicryl on a UR-5 needle and the fascia was closed with 0 Vicryl on a UR-5 needle.  The skin was closed with 4-0 Vicryl in a subcuticular manner.  Dermabond was applied.  Sponge, lap and needle counts correct x 2.  The patient was taken to the recovery room in  stable condition.  The vagina was swabbed with  minimal bleeding noted.   All instrument and needle counts were correct x  3.   The patient was transferred to the recovery room in a stable condition.  [name], MD

## 2016-12-16 NOTE — H&P (View-Only) (Signed)
Consult Note: Gyn-Onc  Shelia Murray 56 y.o. female  CC:  Chief Complaint  Patient presents with  . Endometrial cancer [facility])    HPI: Patient is seen today in consultation at the request of Dr. Ronita Hipps. Primary physician Dr. Joetta Manners.  Patient is a 56 year old who presented for her routine GYN care and had a Pap smear that revealed atypical endometrial/glandular cells of undetermined significance. This then prompted the appropriate evaluation. She underwent a ultrasound on 11/21/2016. It revealed the uterus to be 7 x 4 x 4.3 cm. The endometrial stripe was 14.2 mm. There are multiple fibroids noted with the largest being subserosal at 1.3 x 1.1 cm. She subsequently underwent an endometrial biopsy on the same day as the ultrasound. It revealed a grade 1 endometrioid adenocarcinoma. She comes in today for evaluation of the above. She states that she has been menopausal since about 2014 2015. She has intermittently and sporadically had some postmenopausal bleeding. Occasionally she'll have some postcoital spotting. She never took any hormone replacement therapy. She never recalls having a prior abnormal Pap smear. She did have a D&C in her 41s but she is not sure what the indication for that was.  She's otherwise been in her usual state of health. She is up-to-date on all her routine health care maintenance. Her METs are greater than 4.  Review of Systems; Constitutional: She does complain of bloating. She has gained about 5 pounds in last month. She has lost 8 pounds early in the year and is frustrated by her weight gain. Skin: No rash Cardiovascular: No chest pain, shortness of breath, or edema  Pulmonary: No cough Gastro Intestinal: She does complain of abdominal bloating but no specific pain. Occasionally she's had some nausea which is improved with ginger. It is rare that she will have emesis. She does have some loose stools up to 3-4 per day. She believes this is related to medications.   Genitourinary: Vaginal bleeding as above  Current Meds:  Outpatient Encounter Prescriptions as of 12/03/2016  Medication Sig  . ibuprofen (ADVIL,MOTRIN) 800 MG tablet TK 1 T PO EVERY 8 HOURS PRN FOR PAIN WITH FOOD  . lisinopril (PRINIVIL,ZESTRIL) 20 MG tablet TK 1 T PO QAM  . methocarbamol (ROBAXIN) 500 MG tablet TK 1 T PO Q 6 H PRF SPASMS  . pravastatin (PRAVACHOL) 40 MG tablet TK 1 T PO ONCE A DAY  . SYNJARDY XR 12.07-998 MG TB24 TK 1 T PO BID WC  . TRULICITY 1.5 WV/3.7TG SOPN INJ 0.5 ML [place] ONCE A WEEK  . VESICARE 5 MG tablet TK 1 T PO QD   No facility-administered encounter medications on file as of 12/03/2016.     Allergy:  Allergies  Allergen Reactions  . Amoxicillin Nausea And Vomiting  . Ketamine     Social Hx:   Social History   Social History  . Marital status: Married    Spouse name: N/A  . Number of children: N/A  . Years of education: N/A   Occupational History  . Not on file.   Social History Main Topics  . Smoking status: Former Smoker    Packs/day: 0.25    Years: 20.00    Quit date: 12/03/2001  . Smokeless tobacco: Never Used  . Alcohol use Yes     Comment: Socially  . Drug use: No  . Sexual activity: Yes   Other Topics Concern  . Not on file   Social History Narrative  . No narrative on file  Past Surgical Hx:  Past Surgical History:  Procedure Laterality Date  . LUNG SURGERY Left 2003   Resection of lung nodules LLL    Past Medical Hx: History reviewed. No pertinent past medical history.  Oncology Hx:    Endometrial ca [facility])   11/21/2016 Initial Diagnosis    Endometrial ca (HCC)-grade 1 on endometrial biopsy       Family Hx:  Family History  Problem Relation Age of Onset  . Heart attack Mother   . Hypertension Mother   . Diabetes Maternal Aunt     Vitals:  Blood pressure (!) 142/76, pulse 74, temperature 97.7 F (36.5 C), temperature source Oral, resp. rate 18, height 5\' 5"  (1.651 m), weight 233 lb (105.7 kg), SpO2 99  %.  Physical Exam:  Well-nourished vulva female in no acute distress.  Neck: Supple, no lymphadenopathy, no thyromegaly.  Lungs: Clear to auscultation bilaterally.  Cardiac: Regular rate rhythm.  Abdomen: Obese, soft, nontender, nondistended. She has a well-healed Pfannenstiel skin incision.  Groins: No lymphadenopathy.  Extremity: No edema.  Pelvic: External genitalia within normal limits. Vagina slightly atrophic. The cervix is nulliparous. There's a physiologic discharge. There are no visible lesions. Bimanual examination is limited by habitus and voluntary guarding however the uterus does not appear to be appreciably enlarged. There are no adnexal masses. Rectal confirms.  Assessment/Plan: 56 year old with a clinical stage I grade 1 endometrioid adenocarcinoma. We discussed proceeding with a hysterectomy, bilateral salpingo-oophorectomy as treatment for her endometrial cancer. We reviewed the sentinel lymph node technique. Risks and benefits of sentinel lymph node biopsy were reviewed. We reviewed that we use ICG dye. The patient does not have an iodine allergy her known liver dysfunction. We will the false negative rate of 0.4% and that 3% of patients with metastatic disease will not have it detected by sentinel lymph node biopsy and endometrial cancer. We also discussed that there is a low rate of allergic reaction to the dye which is less than 1%. In the case of failed mapping which can occur up to 40% of the time, a bilateral lower unilateral lymphadenectomy will be performed. She'll benefits of sentinel lymph nodes including a higher detection rate for metastases due to ultra staging and a potential reduction of operative morbidity were discussed. We discussed the risk of lymphedema, nerve dysfunction and lymphocytic process which all potential risks of both the sentinel lymph node technique as well as with complete lymphadenectomy. Additional risks to the patient including the risk of  injury to surrounding organs, thromboembolic disease, infection and bleeding were discussed.  We discussed moving forward with robotic assisted hysterectomy, bilateral salpingo-oophorectomy, sentinel lymph node evaluation, and possible lymph node dissection. We also discussed possible laparotomy. As stated above, the risks of surgery were discussed including but not limited to injury to surrounding organs such as the bowel, bladder, blood vessels, ureters, and nerves. Bleeding which may require a blood transfusion. There is also risks associated with anesthesia. She was to clear understanding and wishes to move forward with surgery. She was given the surgical date of October 2. Initially she felt that that was too soon there has been encouraged her to have the surgery that day. That works for him as he is on hemodialysis and is on a Monday Wednesday Friday schedule.  Her questions as well as those of her husband were elicited in answer to their satisfaction. She had some confusion regarding our discussion of completion lymphadenectomy in the setting of her not mapping. We discussed this  such that she understood. I did discuss that if she did not map on one or both sides we will proceed with a hysterectomy and sent for frozen section before completion lymphadenectomy would be performed in the setting of deeply invasive disease. After showing her pictures and explained this again she understood. We discussed typical recovery in time out of work. She will have FMLA forms sent to [place].  We appreciate the opportunity to partner in the care of this very pleasant patient. Nancy Marus A., MD 12/03/2016, 2:48 PM

## 2016-12-16 NOTE — Anesthesia Procedure Notes (Signed)

## 2016-12-16 NOTE — Discharge Instructions (Signed)
12/16/2016  Return to work: 4 weeks  Activity: 1. Be up and out of the bed during the day.  Take a nap if needed.  You may walk up steps but be careful and use the hand rail.  Stair climbing will tire you more than you think, you may need to stop part way and rest.   2. No lifting or straining for 6 weeks.  3. No driving for 1 weeks.  Do Not drive if you are taking narcotic pain medicine.  4. Shower daily.  Use soap and water on your incision and pat dry; don't rub.   5. No sexual activity and nothing in the vagina for 8 weeks.  Medications:  - Take ibuprofen and tylenol first line for pain control. Take these regularly (every 6 hours) to decrease the build up of pain.  - If necessary, for severe pain not relieved by ibuprofen, take percocet.  - While taking percocet you should take sennakot every night to reduce the likelihood of constipation. If this causes diarrhea, stop its use.  Diet: 1. Low sodium Heart Healthy Diet is recommended.  2. It is safe to use a laxative if you have difficulty moving your bowels.   Wound Care: 1. Keep clean and dry.  Shower daily.  Reasons to call the Doctor:   Fever - Oral temperature greater than 100.4 degrees Fahrenheit  Foul-smelling vaginal discharge  Difficulty urinating  Nausea and vomiting  Increased pain at the site of the incision that is unrelieved with pain medicine.  Difficulty breathing with or without chest pain  New calf pain especially if only on one side  Sudden, continuing increased vaginal bleeding with or without clots.   Follow-up: 1. See Everitt Amber in 3-4 weeks.  Contacts: For questions or concerns you should contact:  Dr. Everitt Amber at [phone] After hours and on week-ends call [phone] and ask to speak to the physician on call for Gynecologic Oncology

## 2016-12-16 NOTE — Anesthesia Preprocedure Evaluation (Signed)
Anesthesia Evaluation  Patient identified by MRN, date of birth, ID band Patient awake    Reviewed: Allergy & Precautions, NPO status , Patient's Chart, lab work & pertinent test results  Airway Mallampati: II  TM Distance: >3 FB Neck ROM: Full    Dental no notable dental hx.    Pulmonary neg pulmonary ROS, former smoker,    Pulmonary exam normal breath sounds clear to auscultation       Cardiovascular hypertension, Normal cardiovascular exam Rhythm:Regular Rate:Normal     Neuro/Psych negative neurological ROS  negative psych ROS   GI/Hepatic negative GI ROS, Neg liver ROS,   Endo/Other  diabetesMorbid obesity  Renal/GU negative Renal ROS  negative genitourinary   Musculoskeletal negative musculoskeletal ROS (+)   Abdominal   Peds negative pediatric ROS (+)  Hematology negative hematology ROS (+)   Anesthesia Other Findings   Reproductive/Obstetrics negative OB ROS                             Anesthesia Physical Anesthesia Plan  ASA: III  Anesthesia Plan: General   Post-op Pain Management:    Induction: Intravenous  PONV Risk Score and Plan: 2 and Ondansetron, Dexamethasone, Treatment may vary due to age or medical condition and Midazolam  Airway Management Planned: Oral ETT  Additional Equipment:   Intra-op Plan:   Post-operative Plan: Extubation in OR  Informed Consent: I have reviewed the patients History and Physical, chart, labs and discussed the procedure including the risks, benefits and alternatives for the proposed anesthesia with the patient or authorized representative who has indicated his/her understanding and acceptance.   Dental advisory given  Plan Discussed with: CRNA and Surgeon  Anesthesia Plan Comments:         Anesthesia Quick Evaluation

## 2016-12-16 NOTE — Anesthesia Postprocedure Evaluation (Signed)
Anesthesia Post Note  Patient: Shelia Murray  Procedure(s) Performed: ROBOTIC ASSISTED TOTAL HYSTERECTOMY WITH BILATERAL SALPINGO OOPHORECTOMY (Bilateral ) SENTINEL NODE BIOPSY (Bilateral )     Patient location during evaluation: PACU Anesthesia Type: General Level of consciousness: awake and alert Pain management: pain level controlled Vital Signs Assessment: post-procedure vital signs reviewed and stable Respiratory status: spontaneous breathing, nonlabored ventilation, respiratory function stable and patient connected to nasal cannula oxygen Cardiovascular status: blood pressure returned to baseline and stable Postop Assessment: no apparent nausea or vomiting Anesthetic complications: no    Last Vitals:  Vitals:   12/16/16 1530 12/16/16 1545  BP: (!) 154/73 (!) 150/82  Pulse: 78 65  Resp: 19 17  Temp:    SpO2: 100% 100%    Last Pain:  Vitals:   12/16/16 1545  TempSrc:   PainSc: Asleep                 Fuquan Wilson S

## 2016-12-16 NOTE — Telephone Encounter (Signed)
Scheduled post op appt for patient, patient to receive appt on discharge summary

## 2016-12-16 NOTE — Interval H&P Note (Signed)
History and Physical Interval Note:  12/16/2016 1:00 PM  Shelia Murray  has presented today for surgery, with the diagnosis of ENDOMETRIAL CANCER  The various methods of treatment have been discussed with the patient and family. After consideration of risks, benefits and other options for treatment, the patient has consented to  Procedure(s): ROBOTIC ASSISTED TOTAL HYSTERECTOMY WITH BILATERAL SALPINGO OOPHORECTOMY (Bilateral) SENTINEL NODE BIOPSY (Bilateral) as a surgical intervention .  The patient's history has been reviewed, patient examined, no change in status, stable for surgery.  I have reviewed the patient's chart and labs.  Questions were answered to the patient's satisfaction.     [name]

## 2016-12-16 NOTE — Interval H&P Note (Signed)
History and Physical Interval Note:  12/16/2016 12:36 PM  Shelia Murray IRIA JAMERSON  has presented today for surgery, with the diagnosis of ENDOMETRIAL CANCER  The various methods of treatment have been discussed with the patient and family. After consideration of risks, benefits and other options for treatment, the patient has consented to  Procedure(s): ROBOTIC ASSISTED TOTAL HYSTERECTOMY WITH BILATERAL SALPINGO OOPHORECTOMY (Bilateral) SENTINEL NODE BIOPSY (Bilateral) as a surgical intervention .  The patient's history has been reviewed, patient examined, no change in status, stable for surgery.  I have reviewed the patient's chart and labs.  Questions were answered to the patient's satisfaction.     [name]

## 2016-12-17 DIAGNOSIS — C541 Malignant neoplasm of endometrium: Secondary | ICD-10-CM | POA: Diagnosis not present

## 2016-12-17 LAB — CBC
HCT: 41.3 % (ref 36.0–46.0)
Hemoglobin: 13.7 g/dL (ref 12.0–15.0)
MCH: 27.4 pg (ref 26.0–34.0)
MCHC: 33.2 g/dL (ref 30.0–36.0)
MCV: 82.6 fL (ref 78.0–100.0)
PLATELETS: 250 10*3/uL (ref 150–400)
RBC: 5 MIL/uL (ref 3.87–5.11)
RDW: 15.1 % (ref 11.5–15.5)
WBC: 11.9 10*3/uL — AB (ref 4.0–10.5)

## 2016-12-17 LAB — GLUCOSE, CAPILLARY
Glucose-Capillary: 121 mg/dL — ABNORMAL HIGH (ref 65–99)
Glucose-Capillary: 145 mg/dL — ABNORMAL HIGH (ref 65–99)

## 2016-12-17 LAB — BASIC METABOLIC PANEL
ANION GAP: 12 (ref 5–15)
BUN: 12 mg/dL (ref 6–20)
CALCIUM: 8.8 mg/dL — AB (ref 8.9–10.3)
CO2: 21 mmol/L — ABNORMAL LOW (ref 22–32)
Chloride: 103 mmol/L (ref 101–111)
Creatinine, Ser: 0.71 mg/dL (ref 0.44–1.00)
GFR calc non Af Amer: >60 mL/min (ref >60)
Glucose, Bld: 128 mg/dL — ABNORMAL HIGH (ref 65–99)
POTASSIUM: 4 mmol/L (ref 3.5–5.1)
SODIUM: 136 mmol/L (ref 135–145)

## 2016-12-17 MED ORDER — OXYCODONE-ACETAMINOPHEN 5-325 MG PO TABS: 1.0000 | ORAL_TABLET | ORAL | 0 refills | Status: DC | PRN

## 2016-12-17 NOTE — Progress Notes (Signed)
Shelia Murray here. Donne Hazel, RN

## 2016-12-17 NOTE — Discharge Summary (Signed)
Physician Discharge Summary  Patient ID: Shelia Murray MRN: [id number] DOB/AGE: 55/02/1961 55 y.o.  Admit date: 12/16/2016 Discharge date: 12/17/2016  Admission Diagnoses: Endometrial ca [facility])  Discharge Diagnoses:  Principal Problem:   Endometrial ca [facility]) Active Problems:   Endometrial cancer [facility])   Discharged Condition:  The patient is in good condition and stable for discharge.    Hospital Course: On 12/16/2016, the patient underwent the following: Procedure(s): ROBOTIC ASSISTED TOTAL HYSTERECTOMY WITH BILATERAL SALPINGO OOPHORECTOMY SENTINEL NODE BIOPSY.   The postoperative course was uneventful.  She was discharged to home on postoperative day 1 tolerating a regular diet, ambulating, pain controlled, voiding.  Consults: None  Significant Diagnostic Studies: None  Treatments: surgery: see above  Discharge Exam: Blood pressure (!) 123/52, pulse 64, temperature 97.7 F (36.5 C), temperature source Oral, resp. rate 18, height 5\' 5"  (1.651 m), weight 232 lb 9.6 oz (105.5 kg), SpO2 99 %. General appearance: alert, cooperative and no distress Resp: clear to auscultation bilaterally Cardio: regular rate and rhythm, S1, S2 normal, no murmur, click, rub or gallop GI: soft, non-tender; bowel sounds normal; no masses,  no organomegaly Extremities: extremities normal, atraumatic, no cyanosis or edema Incision/Wound: Lap sites to the abdomen with dermabond without erythema or drainage.  Mild ecchymosis around right lower abd incision.  Disposition: Home   Discharge Instructions    Call MD for:  difficulty breathing, headache or visual disturbances    Complete by:  As directed    Call MD for:  extreme fatigue    Complete by:  As directed    Call MD for:  hives    Complete by:  As directed    Call MD for:  persistant dizziness or light-headedness    Complete by:  As directed    Call MD for:  persistant nausea and vomiting    Complete by:  As directed    Call MD for:   redness, tenderness, or signs of infection (pain, swelling, redness, odor or green/yellow discharge around incision site)    Complete by:  As directed    Call MD for:  severe uncontrolled pain    Complete by:  As directed    Call MD for:  temperature >100.4    Complete by:  As directed    Diet - low sodium heart healthy    Complete by:  As directed    Driving Restrictions    Complete by:  As directed    No driving for 1 week.  Do not take narcotics and drive.   Increase activity slowly    Complete by:  As directed    Lifting restrictions    Complete by:  As directed    No lifting greater than 10 lbs.   Sexual Activity Restrictions    Complete by:  As directed    No sexual activity, nothing in the vagina, for 8 weeks.     Allergies as of 12/17/2016      Reactions   Ketamine Other (See Comments)   "crazy"   Amoxicillin Nausea And Vomiting      Medication List    TAKE these medications   calcium carbonate 500 MG chewable tablet Commonly known as:  TUMS - dosed in mg elemental calcium Chew 2 tablets by mouth daily as needed for indigestion or heartburn (IN THE EVENING).   ibuprofen 800 MG tablet Commonly known as:  ADVIL,MOTRIN TAKE 1 TABLET BY MOUTH TWICE DAILY AS NEEDED FOR PAIN   lisinopril 20 MG tablet Commonly  known as:  PRINIVIL,ZESTRIL TAKE 1 TABLET BY MOUTH DAILY   methocarbamol 500 MG tablet Commonly known as:  ROBAXIN TAKE 1 TABLET BY MOUTH TWICE DAILY AS NEEDED FOR SPASMS   oxyCODONE-acetaminophen 5-325 MG tablet Commonly known as:  PERCOCET/ROXICET Take 1-2 tablets by mouth every 4 (four) hours as needed (moderate to severe pain).   pravastatin 40 MG tablet Commonly known as:  PRAVACHOL TAKE 1 TABLET BY MOUTH IN THE EVENING   SYNJARDY XR 12.07-998 MG Tb24 Generic drug:  Empagliflozin-Metformin HCl ER TAKE 1 TABLET BY MOUTH TWICE DAILY   TRULICITY 1.5 LY/6.5KP Sopn Generic drug:  Dulaglutide INJ 0.5 ML [place] ONCE A WEEK   VESICARE 5 MG tablet Generic  drug:  solifenacin TAKE 1 TABLET BY MOUTH IN THE EVENING        Greater than thirty minutes were spend for face to face discharge instructions and discharge orders/summary in EPIC.   Signed: Aundraya Dripps DEAL 12/17/2016, 3:08 PM

## 2016-12-17 NOTE — Progress Notes (Signed)
Patient unable to void; bladder scan done with result as charted in [facility]. GYN office called; no answer; voicemail left to return call. Calls also made to Dr Denman George and Joylene John; voicemails left. Donne Hazel, RN

## 2016-12-17 NOTE — Progress Notes (Signed)
Discharge and medication instructions reviewed with patient, daughter and spouse. Questions answered and all deny further questions. One prescription given to patient. Spouse is driving patient home. Donne Hazel, RN

## 2016-12-19 ENCOUNTER — Telehealth: Payer: Self-pay | Admitting: Gynecologic Oncology

## 2016-12-19 NOTE — Telephone Encounter (Signed)
Patient informed of final path results.  Doing well post-operatively.  Bowels and bladder functioning.  Pain controlled.  Advised to call for any needs or concerns.

## 2017-01-12 ENCOUNTER — Ambulatory Visit: Payer: BLUE CROSS/BLUE SHIELD | Attending: Gynecologic Oncology | Admitting: Gynecologic Oncology

## 2017-01-12 ENCOUNTER — Encounter: Payer: Self-pay | Admitting: Gynecologic Oncology

## 2017-01-12 VITALS — BP 155/65 | HR 74 | Temp 97.6°F | Resp 20 | Ht 65.5 in | Wt 226.0 lb

## 2017-01-12 DIAGNOSIS — Z9851 Tubal ligation status: Secondary | ICD-10-CM | POA: Insufficient documentation

## 2017-01-12 DIAGNOSIS — Z8542 Personal history of malignant neoplasm of other parts of uterus: Secondary | ICD-10-CM | POA: Insufficient documentation

## 2017-01-12 DIAGNOSIS — Z90710 Acquired absence of both cervix and uterus: Secondary | ICD-10-CM | POA: Insufficient documentation

## 2017-01-12 DIAGNOSIS — E785 Hyperlipidemia, unspecified: Secondary | ICD-10-CM | POA: Insufficient documentation

## 2017-01-12 DIAGNOSIS — I10 Essential (primary) hypertension: Secondary | ICD-10-CM | POA: Insufficient documentation

## 2017-01-12 DIAGNOSIS — Z8249 Family history of ischemic heart disease and other diseases of the circulatory system: Secondary | ICD-10-CM | POA: Insufficient documentation

## 2017-01-12 DIAGNOSIS — Z87891 Personal history of nicotine dependence: Secondary | ICD-10-CM | POA: Diagnosis not present

## 2017-01-12 DIAGNOSIS — Z7189 Other specified counseling: Secondary | ICD-10-CM

## 2017-01-12 DIAGNOSIS — E119 Type 2 diabetes mellitus without complications: Secondary | ICD-10-CM | POA: Insufficient documentation

## 2017-01-12 DIAGNOSIS — Z79899 Other long term (current) drug therapy: Secondary | ICD-10-CM | POA: Diagnosis not present

## 2017-01-12 DIAGNOSIS — Z833 Family history of diabetes mellitus: Secondary | ICD-10-CM | POA: Diagnosis not present

## 2017-01-12 DIAGNOSIS — Z79891 Long term (current) use of opiate analgesic: Secondary | ICD-10-CM | POA: Diagnosis not present

## 2017-01-12 DIAGNOSIS — Z9889 Other specified postprocedural states: Secondary | ICD-10-CM | POA: Diagnosis not present

## 2017-01-12 DIAGNOSIS — Z08 Encounter for follow-up examination after completed treatment for malignant neoplasm: Secondary | ICD-10-CM | POA: Insufficient documentation

## 2017-01-12 DIAGNOSIS — C541 Malignant neoplasm of endometrium: Secondary | ICD-10-CM

## 2017-01-12 NOTE — Patient Instructions (Signed)
Follow up with Dr. Denman George in April for a follow up appointment.  Call the office in December to schedule the appointment for April 2019.

## 2017-01-13 ENCOUNTER — Encounter: Payer: Self-pay | Admitting: Gynecologic Oncology

## 2017-01-13 NOTE — Progress Notes (Signed)
Consult Note: Gyn-Onc  Shelia Murray 56 y.o. female  CC:  Chief Complaint  Patient presents with  . Endometrial cancer [facility])   Assessment/Plan: 56 year old with a stage IA grade 1 endometrioid endometrial cancer.  Pathology revealed low risk factors for recurrence, therefore no adjuvant therapy is recommended according to NCCN guidelines.  I discussed risk for recurrence and typical symptoms encouraged her to notify us of these should they develop between visits.  I recommend she have follow-up every 6 months for 5 years in accordance with NCCN guidelines. Those visits should include symptom assessment, physical exam and pelvic examination. Pap smears are not indicated or recommended in the routine surveillance of endometrial cancer.  She will follow up with me in 6 months and with Dr Ronita Hipps in 12 months.  HPI: Patient is seen today in consultation at the request of Dr. Ronita Hipps. Primary physician Dr. Joetta Manners.  Patient is a 56 year old who presented for her routine GYN care and had a Pap smear that revealed atypical endometrial/glandular cells of undetermined significance. This then prompted the appropriate evaluation. She underwent a ultrasound on 11/21/2016. It revealed the uterus to be 7 x 4 x 4.3 cm. The endometrial stripe was 14.2 mm. There are multiple fibroids noted with the largest being subserosal at 1.3 x 1.1 cm. She subsequently underwent an endometrial biopsy on the same day as the ultrasound. It revealed a grade 1 endometrioid adenocarcinoma. She comes in today for evaluation of the above. She states that she has been menopausal since about 2014 2015. She has intermittently and sporadically had some postmenopausal bleeding. Occasionally she'll have some postcoital spotting. She never took any hormone replacement therapy. She never recalls having a prior abnormal Pap smear. She did have a D&C in her 36s but she is not sure what the indication for that was.  She's otherwise  been in her usual state of health. She is up-to-date on all her routine health care maintenance. Her METs are greater than 4.  Interval Hx: On 12/16/16 she underwent robotic assisted total hysterectomy, BSO, SLN biopsy. Final pathology revealed no residual carcinoma in the specimen, negative nodes.  She met low risk criteria for recurrence and in accordance with NCCN guidelines, no adjuvant therapy was prescribed.   Review of Systems; Constitutional: She does complain of bloating. She has gained about 5 pounds in last month. She has lost 8 pounds early in the year and is frustrated by her weight gain. Skin: No rash Cardiovascular: No chest pain, shortness of breath, or edema  Pulmonary: No cough Gastro Intestinal: She does complain of abdominal bloating but no specific pain. Occasionally she's had some nausea which is improved with ginger. It is rare that she will have emesis. She does have some loose stools up to 3-4 per day. She believes this is related to medications.  Genitourinary: Vaginal bleeding as above  Current Meds:  Outpatient Encounter Prescriptions as of 01/12/2017  Medication Sig  . calcium carbonate (TUMS - DOSED IN MG ELEMENTAL CALCIUM) 500 MG chewable tablet Chew 2 tablets by mouth daily as needed for indigestion or heartburn (IN THE EVENING).  Marland Kitchen ibuprofen (ADVIL,MOTRIN) 800 MG tablet TAKE 1 TABLET BY MOUTH TWICE DAILY AS NEEDED FOR PAIN  . lisinopril (PRINIVIL,ZESTRIL) 20 MG tablet TAKE 1 TABLET BY MOUTH DAILY  . methocarbamol (ROBAXIN) 500 MG tablet TAKE 1 TABLET BY MOUTH TWICE DAILY AS NEEDED FOR SPASMS  . pravastatin (PRAVACHOL) 40 MG tablet TAKE 1 TABLET BY MOUTH IN THE EVENING  .  SYNJARDY XR 12.07-998 MG TB24 TAKE 1 TABLET BY MOUTH TWICE DAILY  . VESICARE 5 MG tablet TAKE 1 TABLET BY MOUTH IN THE EVENING  . oxyCODONE-acetaminophen (PERCOCET/ROXICET) 5-325 MG tablet Take 1-2 tablets by mouth every 4 (four) hours as needed (moderate to severe pain). (Patient not taking:  Reported on 01/12/2017)  . TRULICITY 1.5 KG/8.1EH SOPN INJ 0.5 ML [place] ONCE A WEEK   No facility-administered encounter medications on file as of 01/12/2017.     Allergy:  Allergies  Allergen Reactions  . Ketamine Other (See Comments)    "crazy"  . Amoxicillin Nausea And Vomiting    Social Hx:   Social History   Social History  . Marital status: Married    Spouse name: N/A  . Number of children: N/A  . Years of education: N/A   Occupational History  . Not on file.   Social History Main Topics  . Smoking status: Former Smoker    Packs/day: 0.25    Years: 20.00    Quit date: 12/03/2001  . Smokeless tobacco: Former Systems developer  . Alcohol use No  . Drug use: No  . Sexual activity: Yes   Other Topics Concern  . Not on file   Social History Narrative  . No narrative on file    Past Surgical Hx:  Past Surgical History:  Procedure Laterality Date  . CESAREAN SECTION    . CYST EXCISION     left anterior torso   . LIPOMA EXCISION     removed from back   . LUNG SURGERY Left 2003   Resection of lung nodules LLL  . ROBOTIC ASSISTED TOTAL HYSTERECTOMY WITH BILATERAL SALPINGO OOPHERECTOMY Bilateral 12/16/2016   Procedure: ROBOTIC ASSISTED TOTAL HYSTERECTOMY WITH BILATERAL SALPINGO OOPHORECTOMY;  Surgeon: Everitt Amber, MD;  Location: [facility];  Service: Gynecology;  Laterality: Bilateral;  . SENTINEL NODE BIOPSY Bilateral 12/16/2016   Procedure: SENTINEL NODE BIOPSY;  Surgeon: Everitt Amber, MD;  Location: [facility];  Service: Gynecology;  Laterality: Bilateral;  . TONSILLECTOMY    . TUBAL LIGATION      Past Medical Hx:  Past Medical History:  Diagnosis Date  . Diabetes mellitus without complication ([place])   . Hand discomfort    right hand , raised area patient describes as painful only when typing, states " I think it may be tendinitis "; "it comes back off and on"   . HLD (hyperlipidemia)   . Hypertension     Oncology Hx:    Endometrial ca ([place])   11/21/2016 Initial Diagnosis     Endometrial ca (HCC)-grade 1 on endometrial biopsy       Family Hx:  Family History  Problem Relation Age of Onset  . Heart attack Mother   . Hypertension Mother   . Diabetes Maternal Aunt   . Diabetes Maternal Grandmother     Vitals:  Blood pressure (!) 155/65, pulse 74, temperature 97.6 F (36.4 C), resp. rate 20, height 5' 5.5" (1.664 m), weight 226 lb (102.5 kg).  Physical Exam:  Well-nourished vulva female in no acute distress.  Neck: Supple, no lymphadenopathy, no thyromegaly.  Lungs: Clear to auscultation bilaterally.  Cardiac: Regular rate rhythm.  Abdomen: Obese, soft, nontender, nondistended. She has a well-healed incisions.  Groins: No lymphadenopathy.  Extremity: No edema.  Pelvic: External genitalia within normal limits. Vaginal cuff in tact and well healed. No lesions.  30 minutes of direct face to face counseling time was spent with the patient. This included discussion about prognosis, therapy  recommendations and postoperative side effects and are beyond the scope of routine postoperative care.   [name], MD 01/13/2017, 6:55 PM

## 2017-02-16 ENCOUNTER — Other Ambulatory Visit: Payer: Self-pay | Admitting: Orthopedic Surgery

## 2017-02-16 DIAGNOSIS — M533 Sacrococcygeal disorders, not elsewhere classified: Secondary | ICD-10-CM

## 2017-02-25 ENCOUNTER — Other Ambulatory Visit: Payer: Self-pay | Admitting: Orthopedic Surgery

## 2017-02-25 DIAGNOSIS — M545 Low back pain: Secondary | ICD-10-CM

## 2017-02-26 ENCOUNTER — Ambulatory Visit
Admission: RE | Admit: 2017-02-26 | Discharge: 2017-02-26 | Disposition: A | Discharge status: Home / Self Care | Payer: BLUE CROSS/BLUE SHIELD | Source: Ambulatory Visit | Attending: Orthopedic Surgery | Admitting: Orthopedic Surgery

## 2017-02-26 DIAGNOSIS — M533 Sacrococcygeal disorders, not elsewhere classified: Secondary | ICD-10-CM

## 2017-03-16 ENCOUNTER — Ambulatory Visit
Admission: RE | Admit: 2017-03-16 | Discharge: 2017-03-16 | Disposition: A | Discharge status: Home / Self Care | Payer: BLUE CROSS/BLUE SHIELD | Source: Ambulatory Visit | Attending: Orthopedic Surgery | Admitting: Orthopedic Surgery

## 2017-03-16 DIAGNOSIS — M545 Low back pain: Secondary | ICD-10-CM

## 2017-11-24 ENCOUNTER — Emergency Department (HOSPITAL_COMMUNITY): Payer: BLUE CROSS/BLUE SHIELD

## 2017-11-24 ENCOUNTER — Other Ambulatory Visit: Payer: Self-pay

## 2017-11-24 ENCOUNTER — Emergency Department (HOSPITAL_COMMUNITY)
Admission: EM | Admit: 2017-11-24 | Discharge: 2017-11-24 | Disposition: A | Discharge status: Home / Self Care | Payer: BLUE CROSS/BLUE SHIELD | Attending: Emergency Medicine | Admitting: Emergency Medicine

## 2017-11-24 DIAGNOSIS — Z79899 Other long term (current) drug therapy: Secondary | ICD-10-CM | POA: Insufficient documentation

## 2017-11-24 DIAGNOSIS — Z87891 Personal history of nicotine dependence: Secondary | ICD-10-CM | POA: Diagnosis not present

## 2017-11-24 DIAGNOSIS — M25461 Effusion, right knee: Secondary | ICD-10-CM

## 2017-11-24 DIAGNOSIS — Y92410 Unspecified street and highway as the place of occurrence of the external cause: Secondary | ICD-10-CM | POA: Insufficient documentation

## 2017-11-24 DIAGNOSIS — S59912A Unspecified injury of left forearm, initial encounter: Secondary | ICD-10-CM | POA: Diagnosis present

## 2017-11-24 DIAGNOSIS — S50812A Abrasion of left forearm, initial encounter: Secondary | ICD-10-CM | POA: Insufficient documentation

## 2017-11-24 DIAGNOSIS — Y9389 Activity, other specified: Secondary | ICD-10-CM | POA: Diagnosis not present

## 2017-11-24 DIAGNOSIS — Y999 Unspecified external cause status: Secondary | ICD-10-CM | POA: Insufficient documentation

## 2017-11-24 DIAGNOSIS — N644 Mastodynia: Secondary | ICD-10-CM

## 2017-11-24 DIAGNOSIS — E119 Type 2 diabetes mellitus without complications: Secondary | ICD-10-CM | POA: Insufficient documentation

## 2017-11-24 DIAGNOSIS — Z7984 Long term (current) use of oral hypoglycemic drugs: Secondary | ICD-10-CM | POA: Insufficient documentation

## 2017-11-24 DIAGNOSIS — L089 Local infection of the skin and subcutaneous tissue, unspecified: Secondary | ICD-10-CM

## 2017-11-24 DIAGNOSIS — S5010XA Contusion of unspecified forearm, initial encounter: Secondary | ICD-10-CM

## 2017-11-24 MED ORDER — METHOCARBAMOL 500 MG PO TABS: 500.0000 mg | ORAL_TABLET | Freq: Two times a day (BID) | ORAL | 0 refills | Status: DC
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
## 2017-11-24 NOTE — Discharge Instructions (Addendum)
Be sure to check on your tetanus status when you seen your pcp

## 2018-03-03 DIAGNOSIS — Z791 Long term (current) use of non-steroidal anti-inflammatories (NSAID): Secondary | ICD-10-CM | POA: Insufficient documentation

## 2018-03-03 DIAGNOSIS — E119 Type 2 diabetes mellitus without complications: Secondary | ICD-10-CM | POA: Insufficient documentation

## 2018-03-03 DIAGNOSIS — E785 Hyperlipidemia, unspecified: Secondary | ICD-10-CM | POA: Insufficient documentation

## 2018-03-03 DIAGNOSIS — R197 Diarrhea, unspecified: Secondary | ICD-10-CM | POA: Diagnosis present

## 2018-03-03 DIAGNOSIS — K529 Noninfective gastroenteritis and colitis, unspecified: Principal | ICD-10-CM | POA: Insufficient documentation

## 2018-03-03 DIAGNOSIS — Z87891 Personal history of nicotine dependence: Secondary | ICD-10-CM | POA: Insufficient documentation

## 2018-03-03 DIAGNOSIS — I700 Atherosclerosis of aorta: Secondary | ICD-10-CM | POA: Diagnosis not present

## 2018-03-03 DIAGNOSIS — K760 Fatty (change of) liver, not elsewhere classified: Secondary | ICD-10-CM | POA: Insufficient documentation

## 2018-03-03 DIAGNOSIS — I10 Essential (primary) hypertension: Secondary | ICD-10-CM | POA: Insufficient documentation

## 2018-03-03 DIAGNOSIS — Z8249 Family history of ischemic heart disease and other diseases of the circulatory system: Secondary | ICD-10-CM | POA: Diagnosis not present

## 2018-03-03 DIAGNOSIS — R918 Other nonspecific abnormal finding of lung field: Secondary | ICD-10-CM | POA: Insufficient documentation

## 2018-03-03 DIAGNOSIS — E669 Obesity, unspecified: Secondary | ICD-10-CM | POA: Diagnosis not present

## 2018-03-03 DIAGNOSIS — K429 Umbilical hernia without obstruction or gangrene: Secondary | ICD-10-CM | POA: Diagnosis not present

## 2018-03-03 DIAGNOSIS — K922 Gastrointestinal hemorrhage, unspecified: Secondary | ICD-10-CM | POA: Insufficient documentation

## 2018-03-03 DIAGNOSIS — Z6839 Body mass index (BMI) 39.0-39.9, adult: Secondary | ICD-10-CM | POA: Diagnosis not present

## 2018-03-03 DIAGNOSIS — Z90710 Acquired absence of both cervix and uterus: Secondary | ICD-10-CM | POA: Insufficient documentation

## 2018-03-03 DIAGNOSIS — Z8542 Personal history of malignant neoplasm of other parts of uterus: Secondary | ICD-10-CM | POA: Diagnosis not present

## 2018-03-03 DIAGNOSIS — Z79899 Other long term (current) drug therapy: Secondary | ICD-10-CM | POA: Insufficient documentation

## 2018-03-03 DIAGNOSIS — R109 Unspecified abdominal pain: Secondary | ICD-10-CM | POA: Diagnosis present

## 2018-03-04 ENCOUNTER — Encounter (HOSPITAL_COMMUNITY): Payer: Self-pay | Admitting: Emergency Medicine

## 2018-03-04 ENCOUNTER — Emergency Department (HOSPITAL_COMMUNITY): Payer: BLUE CROSS/BLUE SHIELD

## 2018-03-04 ENCOUNTER — Observation Stay (HOSPITAL_COMMUNITY)
Admission: EM | Admit: 2018-03-04 | Discharge: 2018-03-04 | Disposition: A | Discharge status: Home / Self Care | Payer: BLUE CROSS/BLUE SHIELD | Attending: Emergency Medicine | Admitting: Emergency Medicine

## 2018-03-04 ENCOUNTER — Other Ambulatory Visit: Payer: Self-pay

## 2018-03-04 DIAGNOSIS — K529 Noninfective gastroenteritis and colitis, unspecified: Secondary | ICD-10-CM

## 2018-03-04 DIAGNOSIS — K922 Gastrointestinal hemorrhage, unspecified: Secondary | ICD-10-CM

## 2018-03-04 DIAGNOSIS — K625 Hemorrhage of anus and rectum: Secondary | ICD-10-CM

## 2018-03-04 LAB — CBC WITH DIFFERENTIAL/PLATELET
Abs Immature Granulocytes: 0.04 10*3/uL (ref 0.00–0.07)
Basophils Absolute: 0.1 10*3/uL (ref 0.0–0.1)
Basophils Relative: 1 %
EOS PCT: 2 %
Eosinophils Absolute: 0.2 10*3/uL (ref 0.0–0.5)
HCT: 45.6 % (ref 36.0–46.0)
Hemoglobin: 14.1 g/dL (ref 12.0–15.0)
Immature Granulocytes: 0 %
Lymphocytes Relative: 19 %
Lymphs Abs: 1.8 10*3/uL (ref 0.7–4.0)
MCH: 28 pg (ref 26.0–34.0)
MCHC: 30.9 g/dL (ref 30.0–36.0)
MCV: 90.5 fL (ref 80.0–100.0)
Monocytes Absolute: 0.8 10*3/uL (ref 0.1–1.0)
Monocytes Relative: 8 %
Neutro Abs: 6.6 10*3/uL (ref 1.7–7.7)
Neutrophils Relative %: 70 %
Platelets: 244 10*3/uL (ref 150–400)
RBC: 5.04 MIL/uL (ref 3.87–5.11)
RDW: 14.6 % (ref 11.5–15.5)
WBC: 9.4 10*3/uL (ref 4.0–10.5)
nRBC: 0 % (ref 0.0–0.2)

## 2018-03-04 LAB — COMPREHENSIVE METABOLIC PANEL
ALT: 17 U/L (ref 0–44)
AST: 19 U/L (ref 15–41)
Albumin: 4 g/dL (ref 3.5–5.0)
Alkaline Phosphatase: 97 U/L (ref 38–126)
Anion gap: 9 (ref 5–15)
BILIRUBIN TOTAL: 0.5 mg/dL (ref 0.3–1.2)
BUN: 13 mg/dL (ref 6–20)
CO2: 27 mmol/L (ref 22–32)
Calcium: 9 mg/dL (ref 8.9–10.3)
Chloride: 105 mmol/L (ref 98–111)
Creatinine, Ser: 0.83 mg/dL (ref 0.44–1.00)
GFR calc Af Amer: >60 mL/min (ref >60)
GFR calc non Af Amer: >60 mL/min (ref >60)
Glucose, Bld: 129 mg/dL — ABNORMAL HIGH (ref 70–99)
Potassium: 3.7 mmol/L (ref 3.5–5.1)
Sodium: 141 mmol/L (ref 135–145)
Total Protein: 7.2 g/dL (ref 6.5–8.1)

## 2018-03-04 LAB — CBC
HCT: 42.6 % (ref 36.0–46.0)
HCT: 45.8 % (ref 36.0–46.0)
Hemoglobin: 13.4 g/dL (ref 12.0–15.0)
Hemoglobin: 14.3 g/dL (ref 12.0–15.0)
MCH: 27.5 pg (ref 26.0–34.0)
MCH: 27.6 pg (ref 26.0–34.0)
MCHC: 31.2 g/dL (ref 30.0–36.0)
MCHC: 31.5 g/dL (ref 30.0–36.0)
MCV: 87.3 fL (ref 80.0–100.0)
MCV: 88.4 fL (ref 80.0–100.0)
NRBC: 0 % (ref 0.0–0.2)
Platelets: 272 10*3/uL (ref 150–400)
Platelets: 296 10*3/uL (ref 150–400)
RBC: 4.88 MIL/uL (ref 3.87–5.11)
RBC: 5.18 MIL/uL — ABNORMAL HIGH (ref 3.87–5.11)
RDW: 14.6 % (ref 11.5–15.5)
RDW: 14.6 % (ref 11.5–15.5)
WBC: 10.5 10*3/uL (ref 4.0–10.5)
WBC: 12.4 10*3/uL — ABNORMAL HIGH (ref 4.0–10.5)
nRBC: 0 % (ref 0.0–0.2)

## 2018-03-04 LAB — URINALYSIS, ROUTINE W REFLEX MICROSCOPIC
Bacteria, UA: NONE SEEN
Bilirubin Urine: NEGATIVE
Glucose, UA: >=500 mg/dL — AB
Hgb urine dipstick: NEGATIVE
Ketones, ur: NEGATIVE mg/dL
Leukocytes, UA: NEGATIVE
Nitrite: NEGATIVE
Protein, ur: NEGATIVE mg/dL
SPECIFIC GRAVITY, URINE: 1.032 — AB (ref 1.005–1.030)
pH: 5 (ref 5.0–8.0)

## 2018-03-04 LAB — I-STAT BETA HCG BLOOD, ED (MC, WL, AP ONLY): I-stat hCG, quantitative: <5 m[IU]/mL (ref <5)

## 2018-03-04 LAB — LIPASE, BLOOD: Lipase: 29 U/L (ref 11–51)

## 2018-03-04 MED ORDER — ALBUTEROL SULFATE (2.5 MG/3ML) 0.083% IN NEBU: 2.5000 mg | INHALATION_SOLUTION | Freq: Four times a day (QID) | RESPIRATORY_TRACT | Status: DC | PRN

## 2018-03-04 MED ORDER — SODIUM CHLORIDE (PF) 0.9 % IJ SOLN
INTRAMUSCULAR | Status: AC
  Filled 2018-03-04: qty 50

## 2018-03-04 MED ORDER — ONDANSETRON HCL 4 MG PO TABS: 4.0000 mg | ORAL_TABLET | Freq: Four times a day (QID) | ORAL | Status: DC | PRN

## 2018-03-04 MED ORDER — SODIUM CHLORIDE 0.9 % IV SOLN
Freq: Once | INTRAVENOUS | Status: AC
  Administered 2018-03-04: 06:00:00 via INTRAVENOUS

## 2018-03-04 MED ORDER — ACETAMINOPHEN 650 MG RE SUPP: 650.0000 mg | Freq: Four times a day (QID) | RECTAL | Status: DC | PRN

## 2018-03-04 MED ORDER — METRONIDAZOLE IN NACL 5-0.79 MG/ML-% IV SOLN
500.0000 mg | Freq: Once | INTRAVENOUS | Status: AC
  Administered 2018-03-04: 500 mg via INTRAVENOUS
  Filled 2018-03-04: qty 100

## 2018-03-04 MED ORDER — CIPROFLOXACIN HCL 500 MG PO TABS: 500.0000 mg | ORAL_TABLET | Freq: Two times a day (BID) | ORAL | 0 refills | Status: AC

## 2018-03-04 MED ORDER — ACETAMINOPHEN 325 MG PO TABS: 650.0000 mg | ORAL_TABLET | Freq: Four times a day (QID) | ORAL | Status: DC | PRN

## 2018-03-04 MED ORDER — IOPAMIDOL (ISOVUE-300) INJECTION 61%
100.0000 mL | Freq: Once | INTRAVENOUS | Status: AC | PRN
  Administered 2018-03-04: 100 mL via INTRAVENOUS

## 2018-03-04 MED ORDER — CIPROFLOXACIN IN D5W 400 MG/200ML IV SOLN
400.0000 mg | Freq: Once | INTRAVENOUS | Status: AC
  Administered 2018-03-04: 400 mg via INTRAVENOUS
  Filled 2018-03-04: qty 200

## 2018-03-04 MED ORDER — ONDANSETRON HCL 4 MG/2ML IJ SOLN: 4.0000 mg | Freq: Four times a day (QID) | INTRAMUSCULAR | Status: DC | PRN

## 2018-03-04 MED ORDER — METRONIDAZOLE 500 MG PO TABS: 500.0000 mg | ORAL_TABLET | Freq: Three times a day (TID) | ORAL | 0 refills | Status: DC

## 2018-03-04 MED ORDER — METRONIDAZOLE IN NACL 5-0.79 MG/ML-% IV SOLN: 500.0000 mg | Freq: Three times a day (TID) | INTRAVENOUS | Status: DC

## 2018-03-04 MED ORDER — SODIUM CHLORIDE 0.9 % IV BOLUS
1000.0000 mL | Freq: Once | INTRAVENOUS | Status: AC
  Administered 2018-03-04: 1000 mL via INTRAVENOUS

## 2018-03-04 MED ORDER — IOPAMIDOL (ISOVUE-300) INJECTION 61%
INTRAVENOUS | Status: AC
  Filled 2018-03-04: qty 100

## 2018-03-04 MED ORDER — PRAVASTATIN SODIUM 20 MG PO TABS: 40.0000 mg | ORAL_TABLET | Freq: Every evening | ORAL | Status: DC

## 2018-03-04 MED ORDER — LISINOPRIL 20 MG PO TABS: 20.0000 mg | ORAL_TABLET | Freq: Every day | ORAL | Status: DC

## 2018-03-04 MED ORDER — CIPROFLOXACIN IN D5W 400 MG/200ML IV SOLN: 400.0000 mg | Freq: Two times a day (BID) | INTRAVENOUS | Status: DC

## 2018-03-04 NOTE — ED Triage Notes (Signed)
Patient states she has been having diarhea since yesterday. Patient states she has started seeing blood in her stool. Patient states she is also having abdominal pain with that.

## 2018-03-04 NOTE — ED Notes (Signed)
Pt to restroom to try and give stool sample

## 2018-03-04 NOTE — ED Notes (Signed)
hospitalist at bedside

## 2018-03-04 NOTE — Discharge Instructions (Signed)
Take ciprofloxacin and Flagyl as prescribed for management of your colitis seen on CT scan.  Do not drink alcohol while taking Flagyl as this will make you violently ill and cause you to vomit.  We advised that you refrain from use of ibuprofen or Aleve as this may worsen bleeding.  Take Tylenol as needed for management of abdominal cramping.  Drink plenty of clear liquids to prevent dehydration.  Follow-up with your primary care doctor to ensure that symptoms resolved.  Return to the ED for new or concerning symptoms such as fever over 101F, severe worsening of your abdominal pain, vomiting with inability to tolerate fluids, worsening rectal bleeding, lightheadedness, shortness of breath.

## 2018-03-04 NOTE — ED Provider Notes (Signed)
7;30 CBC to reevaluate hemoglobin.  Hemoglobin 14.  Pt discharged with follow up instructions    Fransico Meadow, PA-C 03/04/18 [id number]    Rolland Porter, MD 03/05/18 [phone]

## 2018-03-04 NOTE — ED Notes (Signed)
Unable to get stool sample.

## 2018-03-04 NOTE — ED Provider Notes (Addendum)
[facility] DEPT Provider Note   CSN: [id number] Arrival date & time: 03/03/18  2239    History   Chief Complaint Chief Complaint  Patient presents with  . Diarrhea  . Rectal Bleeding  . Abdominal Pain    HPI Shelia Murray is a 57 y.o. female.  57 year old female with a history of diabetes, dyslipidemia, hypertension presents to the ED for evaluation of hematochezia.  States that Shelia Murray awoke from sleep at 4 AM on Wednesday with abdominal cramping and diarrhea.  Had bowel movements every 15 to 20 minutes.  Describes them as watery.  Shelia Murray noticed that Shelia Murray was passing very little stool, but increased blood with clots around 1600.  Has had multiple episodes of hematochezia preceded by abdominal cramping.  Shelia Murray denies any recent antibiotic use, recent travel, questionable food ingestion.  Her last colonoscopy was approximately 5 years ago.  Shelia Murray has no history of diverticulitis.  He has not taken any medications for her symptoms, but has continued to be able to tolerate fluids by mouth.  Abdominal surgical history significant for cesarean section, tubal ligation, hysterectomy.  The history is provided by the patient. No language interpreter was used.  Diarrhea   Associated symptoms include abdominal pain.  Rectal Bleeding  Associated symptoms: abdominal pain   Abdominal Pain   Associated symptoms include diarrhea and hematochezia.    Past Medical History:  Diagnosis Date  . Diabetes mellitus without complication ([place])   . Hand discomfort    right hand , raised area patient describes as painful only when typing, states " I think it may be tendinitis "; "it comes back off and on"   . HLD (hyperlipidemia)   . Hypertension     Patient Active Problem List   Diagnosis Date Noted  . Endometrial cancer ([place]) 12/16/2016  . Endometrial ca ([place]) 12/03/2016  . DM (diabetes mellitus) ([place]) 12/03/2016  . Obesity (BMI 30-39.9) 12/03/2016    Past Surgical  History:  Procedure Laterality Date  . CESAREAN SECTION    . CYST EXCISION     left anterior torso   . LIPOMA EXCISION     removed from back   . LUNG SURGERY Left 2003   Resection of lung nodules LLL  . ROBOTIC ASSISTED TOTAL HYSTERECTOMY WITH BILATERAL SALPINGO OOPHERECTOMY Bilateral 12/16/2016   Procedure: ROBOTIC ASSISTED TOTAL HYSTERECTOMY WITH BILATERAL SALPINGO OOPHORECTOMY;  Surgeon: Everitt Amber, MD;  Location: [facility];  Service: Gynecology;  Laterality: Bilateral;  . SENTINEL NODE BIOPSY Bilateral 12/16/2016   Procedure: SENTINEL NODE BIOPSY;  Surgeon: Everitt Amber, MD;  Location: [facility];  Service: Gynecology;  Laterality: Bilateral;  . TONSILLECTOMY    . TUBAL LIGATION       OB History   No obstetric history on file.      Home Medications    Prior to Admission medications   Medication Sig Start Date End Date Taking? Authorizing Provider  cyclobenzaprine (FLEXERIL) 10 MG tablet Take 10 mg by mouth 3 (three) times daily as needed for muscle spasms.   Yes [provider]  empagliflozin (JARDIANCE) 25 MG TABS tablet Take 25 mg by mouth daily.   Yes [provider]  furosemide (LASIX) 20 MG tablet Take 20 mg by mouth daily as needed for fluid.   Yes [provider]  ibuprofen (ADVIL,MOTRIN) 800 MG tablet TAKE 1 TABLET BY MOUTH TWICE DAILY AS NEEDED FOR PAIN 11/04/16  Yes [provider]  lisinopril (PRINIVIL,ZESTRIL) 20 MG tablet TAKE 1 TABLET  BY MOUTH DAILY 09/27/16  Yes [provider]  methocarbamol (ROBAXIN) 500 MG tablet Take 1 tablet (500 mg total) by mouth 2 (two) times daily. Patient taking differently: Take 500 mg by mouth daily as needed for muscle spasms.  11/24/17  Yes Glendell Docker, NP  pravastatin (PRAVACHOL) 40 MG tablet TAKE 1 TABLET BY MOUTH IN THE EVENING 09/18/16  Yes [provider]  TRULICITY 1.5 HF/0.2OV SOPN 0.5 mLs.  11/12/16  Yes [provider]  VESICARE 5 MG tablet TAKE 1 TABLET BY MOUTH IN THE  EVENING 11/25/16  Yes [provider]  oxyCODONE-acetaminophen (PERCOCET/ROXICET) 5-325 MG tablet Take 1-2 tablets by mouth every 4 (four) hours as needed (moderate to severe pain). Patient not taking: Reported on 01/12/2017 12/17/16   Joylene John D, NP    Family History Family History  Problem Relation Age of Onset  . Heart attack Mother   . Hypertension Mother   . Diabetes Maternal Aunt   . Diabetes Maternal Grandmother     Social History Social History   Tobacco Use  . Smoking status: Former Smoker    Packs/day: 0.25    Years: 20.00    Pack years: 5.00    Last attempt to quit: 12/03/2001    Years since quitting: 16.2  . Smokeless tobacco: Former Network engineer Use Topics  . Alcohol use: No  . Drug use: No     Allergies   Ketamine; Metformin and related; and Amoxicillin   Review of Systems Review of Systems  Gastrointestinal: Positive for abdominal pain, diarrhea and hematochezia.  Ten systems reviewed and are negative for acute change, except as noted in the HPI.    Physical Exam Updated Vital Signs BP (!) 131/59   Pulse 76   Temp 98.8 F (37.1 C) (Oral)   Resp 16   Ht 5\' 5"  (1.651 m)   Wt 102.2 kg   SpO2 94%   BMI 37.49 kg/m   Physical Exam Vitals signs and nursing note reviewed.  Constitutional:      General: Shelia Murray is not in acute distress.    Appearance: Shelia Murray is well-developed. Shelia Murray is not diaphoretic.     Comments: Nontoxic appearing and in NAD  HENT:     Head: Normocephalic and atraumatic.  Eyes:     General: No scleral icterus.    Conjunctiva/sclera: Conjunctivae normal.  Neck:     Musculoskeletal: Normal range of motion.  Cardiovascular:     Rate and Rhythm: Normal rate and regular rhythm.     Pulses: Normal pulses.  Pulmonary:     Effort: Pulmonary effort is normal. No respiratory distress.     Comments: Respirations even and unlabored Abdominal:     Palpations: There is no mass.     Tenderness: There is abdominal tenderness.  There is no guarding.     Comments: Soft, obese abdomen with TTP in the LLQ. No guarding or peritoneal signs.  Musculoskeletal: Normal range of motion.  Skin:    General: Skin is warm and dry.     Coloration: Skin is not pale.     Findings: No erythema or rash.  Neurological:     Mental Status: Shelia Murray is alert and oriented to person, place, and time.     Comments: Ambulatory with steady gait.  Psychiatric:        Behavior: Behavior normal.      ED Treatments / Results  Labs (all labs ordered are listed, but only abnormal results are displayed) Labs Reviewed  COMPREHENSIVE METABOLIC PANEL - Abnormal; Notable for the following components:      Result Value   Glucose, Bld 129 (*)    All other components within normal limits  CBC - Abnormal; Notable for the following components:   WBC 12.4 (*)    RBC 5.18 (*)    All other components within normal limits  URINALYSIS, ROUTINE W REFLEX MICROSCOPIC - Abnormal; Notable for the following components:   Specific Gravity, Urine 1.032 (*)    Glucose, UA >=500 (*)    All other components within normal limits  GASTROINTESTINAL PANEL BY PCR, STOOL (REPLACES STOOL CULTURE)  LIPASE, BLOOD  CBC  I-STAT BETA HCG BLOOD, ED ([facility], [facility], [facility] ONLY)  POC OCCULT BLOOD, ED    EKG None  Radiology Ct Abdomen Pelvis W Contrast  Result Date: 03/04/2018 CLINICAL DATA:  Abdominal pain and diarrhea for 2 days. Blood in the stool. EXAM: CT ABDOMEN AND PELVIS WITH CONTRAST TECHNIQUE: Multidetector CT imaging of the abdomen and pelvis was performed using the standard protocol following bolus administration of intravenous contrast. CONTRAST:  154mL ISOVUE-300 IOPAMIDOL (ISOVUE-300) INJECTION 61% COMPARISON:  None. FINDINGS: Lower chest: Mild dependent changes in the lung bases. Hepatobiliary: Mild diffuse fatty infiltration of the liver. No focal lesions. Gallbladder and bile ducts are unremarkable. Pancreas: Unremarkable. No pancreatic ductal dilatation or  surrounding inflammatory changes. Spleen: Normal in size without focal abnormality. Adrenals/Urinary Tract: Adrenal glands are unremarkable. Kidneys are normal, without renal calculi, focal lesion, or hydronephrosis. Bladder is unremarkable. Stomach/Bowel: Stomach, small bowel, and colon are not abnormally distended. The descending colon wall is thickened with stranding around the pericolonic fat of the descending region. This is consistent with a localized colitis. Although few scattered diverticula are present, the long segment of involvement makes diverticulitis less likely. Infectious colitis or Crohn's disease may be more likely. No abscess. Vascular/Lymphatic: Aortic atherosclerosis. No enlarged abdominal or pelvic lymph nodes. Reproductive: Status post hysterectomy. No adnexal masses. Other: No free air or free fluid in the abdomen. Minimal periumbilical hernia containing fat. Musculoskeletal: No acute or significant osseous findings. IMPRESSION: Wall thickening and pericolonic infiltration involving the descending colon consistent with localized colitis. Consider infectious colitis or Crohn's disease. Diverticulitis less likely due to length of involvement. No abscess. Mild diffuse fatty infiltration of the liver. Aortic atherosclerosis. Electronically Signed   By: Lucienne Capers M.D.   On: 03/04/2018 04:01    Procedures Procedures (including critical care time)  Medications Ordered in ED Medications  sodium chloride (PF) 0.9 % injection (  Canceled Entry 03/04/18 0401)  ciprofloxacin (CIPRO) IVPB 400 mg (has no administration in time range)  metroNIDAZOLE (FLAGYL) IVPB 500 mg (has no administration in time range)  sodium chloride 0.9 % bolus 1,000 mL (1,000 mLs Intravenous New Bag/Given 03/04/18 0332)  iopamidol (ISOVUE-300) 61 % injection 100 mL ( Intravenous Canceled Entry 03/04/18 0400)     Initial Impression / Assessment and Plan / ED Course  I have reviewed the triage vital signs and  the nursing notes.  Pertinent labs & imaging results that were available during my care of the patient were reviewed by me and considered in my medical decision making (see chart for details).     Patient to be admitted for further management of descending colitis with associated hematochezia.  Shelia Murray has been started on ciprofloxacin and Flagyl IV.  Will require serial CBCs.  Patient presently comfortable and nontoxic.  Shelia Murray has been hemodynamically stable since arrival.  5:46 AM Patient was seen by  hospitalist, Dr. Tamala Julian, and decided that Shelia Murray no longer wished to be admitted.  Shelia Murray is agreeable to complete a dose of IV ciprofloxacin and IV Flagyl prior to discharge.  Shelia Murray would also like to have a repeat CBC drawn at 7:30 AM to ensure that her hemoglobin is not significantly changing.  If Shelia Murray has more than a 2.5-3 g hemoglobin drop, then patient would be open to observation admission rather than outpatient management.   Patient signed out to Alyse Low, PA-C at shift change who will assume care and disposition appropriately.   Final Clinical Impressions(s) / ED Diagnoses   Final diagnoses:  Colitis  Lower GI bleed    ED Discharge Orders    None       Antonietta Breach, PA-C 03/04/18 0520    Antonietta Breach, PA-C 03/04/18 1969    Rolland Porter, MD 03/04/18 [phone]

## 2018-03-04 NOTE — ED Notes (Signed)
CBC recollected and sent to lab 

## 2019-02-25 ENCOUNTER — Other Ambulatory Visit: Payer: Self-pay

## 2019-02-25 DIAGNOSIS — Z20822 Contact with and (suspected) exposure to covid-19: Secondary | ICD-10-CM

## 2019-02-27 LAB — NOVEL CORONAVIRUS, NAA: SARS-CoV-2, NAA: NOT DETECTED

## 2020-02-17 ENCOUNTER — Telehealth: Payer: Self-pay | Admitting: *Deleted

## 2020-02-17 NOTE — Telephone Encounter (Signed)
Patient scheduled on 12/17 with Dr Denman George for a new patient appt at 9 am. Patient offered earlier appt, but out of town with daughter that's in the hospital

## 2020-03-02 ENCOUNTER — Telehealth: Payer: Self-pay | Admitting: *Deleted

## 2020-03-02 NOTE — Telephone Encounter (Signed)
Patient called and stated "I want to move the appt from 12/23; that is the day before my birthday and Christmas. I just don't want to be depressed." Per patient request appt moved to 1/12

## 2020-03-08 ENCOUNTER — Ambulatory Visit: Payer: BLUE CROSS/BLUE SHIELD | Admitting: Gynecologic Oncology

## 2020-03-27 ENCOUNTER — Encounter: Payer: Self-pay | Admitting: Gynecologic Oncology

## 2020-03-28 ENCOUNTER — Other Ambulatory Visit: Payer: Self-pay

## 2020-03-28 ENCOUNTER — Other Ambulatory Visit (HOSPITAL_COMMUNITY)
Admission: RE | Admit: 2020-03-28 | Discharge: 2020-03-28 | Disposition: A | Discharge status: Home / Self Care | Payer: BC Managed Care – PPO | Source: Ambulatory Visit | Attending: Gynecologic Oncology | Admitting: Gynecologic Oncology

## 2020-03-28 ENCOUNTER — Encounter: Payer: Self-pay | Admitting: Gynecologic Oncology

## 2020-03-28 ENCOUNTER — Inpatient Hospital Stay: Payer: BC Managed Care – PPO | Attending: Gynecologic Oncology | Admitting: Gynecologic Oncology

## 2020-03-28 VITALS — BP 147/72 | HR 66 | Resp 18 | Ht 65.0 in | Wt 219.9 lb

## 2020-03-28 DIAGNOSIS — I10 Essential (primary) hypertension: Secondary | ICD-10-CM | POA: Diagnosis not present

## 2020-03-28 DIAGNOSIS — Z8542 Personal history of malignant neoplasm of other parts of uterus: Secondary | ICD-10-CM | POA: Insufficient documentation

## 2020-03-28 DIAGNOSIS — E785 Hyperlipidemia, unspecified: Secondary | ICD-10-CM | POA: Diagnosis not present

## 2020-03-28 DIAGNOSIS — Z79899 Other long term (current) drug therapy: Secondary | ICD-10-CM | POA: Insufficient documentation

## 2020-03-28 DIAGNOSIS — N893 Dysplasia of vagina, unspecified: Secondary | ICD-10-CM | POA: Insufficient documentation

## 2020-03-28 DIAGNOSIS — E119 Type 2 diabetes mellitus without complications: Secondary | ICD-10-CM | POA: Diagnosis not present

## 2020-03-28 DIAGNOSIS — Z90710 Acquired absence of both cervix and uterus: Secondary | ICD-10-CM | POA: Insufficient documentation

## 2020-03-28 DIAGNOSIS — Z90722 Acquired absence of ovaries, bilateral: Secondary | ICD-10-CM | POA: Insufficient documentation

## 2020-03-28 NOTE — Progress Notes (Signed)
Consult Note: Gyn-Onc  Consult was requested by Dr. Ronita Hipps for the evaluation of Sharlett Iles 60 y.o. female  CC:  Chief Complaint  Patient presents with  . Vaginal dysplasia    New Issue  . History of endometrial cancer    Assessment/Plan:  Ms. PRAIRIE STENBERG  is a 60 y.o.  year old with a history of an LGSIL pap after a prior hysterectomy for endometrial cancer (stage IA grade 1 endometrioid endometrial cancer in October, 2022).  I see no evidence of recurrent endometrial cancer on today's examination.  I see no evidence of vaginal cancer or gross vaginal dysplasia.  Pap test and HPV testing was performed today.  I suspect that the prior LGSIL findings were secondary to some vaginal atrophy from a postmenopausal state.  I suspect this given that in 2020 her HPV testing was negative.  Her post hysterectomy Paps would likely performed due to her history of an AGUS Pap at the time of endometrial cancer diagnosis in 2018.  However this AGUS Pap was almost certainly a product of her endometrial carcinoma and not associated with HPV related cervical disease.  This is supported by the final pathology from her hysterectomy revealing no cervical dysplasia, and her personal history of no abnormal Pap smears since age 18.  Therefore, recommendations for Paps smear screening after hysterectomy do not apply to this patient as her AGUS Pap smear was not an HPV related cervical pathology and therefore she is not at increased risk for future vaginal cancer.  All of this is conditional based on her HPV status being negative which we have following up today.  I explained that mild abnormal cellular changes can be seen on Pap smear as a postmenopausal vagina, which is why there is a very high false positive rate on Pap smears after hysterectomy for endometrial cancer and therefore we do not recommend Pap smears that surveillance (once again unless there is HPV associated cervical dysplasia in the  recent past).  I have outlined to the patient for potential Pap testing results and options for treatment: 1/normal cytology, negative hr HPV in which case no additional interventions are necessary in future Pap smears are not indicated. 2/abnormal cytology with negative hr HPV in which case we would recommend a course of vaginal estrogen to strengthen the postmenopausal changes to her vaginal cells. 3/normal cytology, positive hr HPV: In which case we would recommend annual surveillance with Pap testing and high risk HPV testing for approximately 20 years.  A course of vaginal estrogen has been shown to improve clearance of HPV in postmenopausal women.  This would be recommended. 4/abnormal cytology, positive hr HPV: In which case we would recommend treatment of her dysplasia if it was high-grade (VIN 2 or 3).  I recommend either vaginal estrogen, or vaginal 5-FU.  In my opinion options number 1 or 2 of the most likely results from today's testing.  She will return in 6 months for routine endometrial cancer surveillance.   HPI: Ms Grady Lucci is a 60 year old woman with a history of stage IA grade 1 endometrioid endometrial cancer in 2018 who was seen in consultation at the request of Dr Ronita Hipps for evaluation of LGSIL on a pap.  The patient was known to me from her diagnosis of endometrial cancer in 2018.  At that time she had some abnormal bleeding symptoms but essentially her cancer was diagnosed after the finding of an AGUS Pap smear in 2018.  HPV testing was  not performed on that Pap.  But it treated and work-up with an ultrasound that revealed a thickened endometrial stripe, and an endometrial biopsy which confirmed a grade 1 endometrial cancer.  At the time of diagnosis she reported to me that she she had last had an abnormal Pap smear possibly at age 68 or 48, with no subsequent therapy recommended, and no abnormal Pap smears since that time prior to her AGUS Pap smear in 2018.  She  subsequently underwent a robotic assisted total hysterectomy with BSO and sentinel lymph node biopsy on 12/16/2016.  Final pathology from that surgery revealed no residual carcinoma in the specimen with negative nodes.  The cervix was noted to be histologically normal with no dysplasia or malignancy.  She met low risk criteria for recurrence in accordance with NCCN guidelines no adjuvant therapy was prescribed.  She subsequently followed up with Dr. Ronita Hipps and underwent a Pap smear in October, 2020 that revealed LG SIL changes negative for high-risk HPV.  I presume this Pap smear was performed due to her history of an AGUS Pap smear in 2018.  Following her LGSIL Pap and 2020 she underwent a repeat Pap on 12/28/2019.  High risk HPV cotesting was not performed as it was ordered as "reflex" and reflex criteria were not met.  This 2021 Pap revealed low-grade squamous intraepithelial lesion.  To follow-up the LGSIL Pap that she had had in 2020 and 2021 she underwent vaginal colposcopy with biopsy on 02/01/20.  Notes from the examination revealed a normal-appearing vaginal wall.  Biopsy was collected from the vaginal cuff presumably and revealed rare squamous epithelium that was benign but insufficient for diagnosis.  She denied symptoms of vaginal bleeding or vaginal irritation.  She denied new pelvic pain, abdominal bloating, or symptoms concerning for endometrial cancer recurrence.  She is a non-smoker.  She denies problematic menopausal symptoms.  Current Meds:  Outpatient Encounter Medications as of 03/28/2020  Medication Sig  . Clindamycin-Benzoyl Per, Refr, gel Apply topically every morning.  . furosemide (LASIX) 20 MG tablet Take 20 mg by mouth daily as needed for fluid.  Marland Kitchen ibuprofen (ADVIL,MOTRIN) 800 MG tablet TAKE 1 TABLET BY MOUTH TWICE DAILY AS NEEDED FOR PAIN  . lisinopril (PRINIVIL,ZESTRIL) 20 MG tablet TAKE 1 TABLET BY MOUTH DAILY  . Microlet Lancets MISC See admin instructions.  .  pravastatin (PRAVACHOL) 40 MG tablet TAKE 1 TABLET BY MOUTH IN THE EVENING  . TRULICITY 1.5 LZ/7.6BH SOPN 0.5 mLs.   . VESICARE 5 MG tablet TAKE 1 TABLET BY MOUTH IN THE EVENING  . ammonium lactate (LAC-HYDRIN) 12 % lotion Apply to dark areas on thighs 1-2 times daily (Patient not taking: Reported on 03/27/2020)  . clindamycin (CLEOCIN T) 1 % external solution Apply to groin daily as needed for bumps. (Patient not taking: Reported on 03/27/2020)  . clobetasol ointment (TEMOVATE) 0.05 % Apply to scalp 3-4x weekly. Never to face. (Patient not taking: Reported on 03/27/2020)  . cyclobenzaprine (FLEXERIL) 10 MG tablet Take 10 mg by mouth 3 (three) times daily as needed for muscle spasms. (Patient not taking: Reported on 03/27/2020)  . [DISCONTINUED] empagliflozin (JARDIANCE) 25 MG TABS tablet Take 25 mg by mouth daily.  . [DISCONTINUED] methocarbamol (ROBAXIN) 500 MG tablet Take 1 tablet (500 mg total) by mouth 2 (two) times daily. (Patient not taking: Reported on 03/27/2020)  . [DISCONTINUED] metroNIDAZOLE (FLAGYL) 500 MG tablet Take 1 tablet (500 mg total) by mouth 3 (three) times daily.  . [DISCONTINUED] oxyCODONE-acetaminophen (PERCOCET/ROXICET) 5-325  MG tablet Take 1-2 tablets by mouth every 4 (four) hours as needed (moderate to severe pain). (Patient not taking: Reported on 01/12/2017)   No facility-administered encounter medications on file as of 03/28/2020.    Allergy:  Allergies  Allergen Reactions  . Ketamine Itching and Other (See Comments)    "crazy", Confusion.   . Metformin And Related Diarrhea  . Amoxicillin Nausea And Vomiting    Pt states it was a high dose of Amoxicillin    Social Hx:   Social History   Socioeconomic History  . Marital status: Married    Spouse name: Not on file  . Number of children: Not on file  . Years of education: Not on file  . Highest education level: Not on file  Occupational History  . Not on file  Tobacco Use  . Smoking status: Former Smoker     Packs/day: 0.25    Years: 20.00    Pack years: 5.00    Quit date: 12/03/2001    Years since quitting: 18.3  . Smokeless tobacco: Never Used  Vaping Use  . Vaping Use: Never used  Substance and Sexual Activity  . Alcohol use: No  . Drug use: No  . Sexual activity: Not Currently  Other Topics Concern  . Not on file  Social History Narrative  . Not on file   Social Determinants of Health   Financial Resource Strain: Not on file  Food Insecurity: Not on file  Transportation Needs: Not on file  Physical Activity: Not on file  Stress: Not on file  Social Connections: Not on file  Intimate Partner Violence: Not on file    Past Surgical Hx:  Past Surgical History:  Procedure Laterality Date  . CESAREAN SECTION    . CYST EXCISION     left anterior torso   . LIPOMA EXCISION     removed from back   . LUNG SURGERY Left 2003   Resection of lung nodules LLL  . ROBOTIC ASSISTED TOTAL HYSTERECTOMY WITH BILATERAL SALPINGO OOPHERECTOMY Bilateral 12/16/2016   Procedure: ROBOTIC ASSISTED TOTAL HYSTERECTOMY WITH BILATERAL SALPINGO OOPHORECTOMY;  Surgeon: Everitt Amber, MD;  Location: [facility];  Service: Gynecology;  Laterality: Bilateral;  . SENTINEL NODE BIOPSY Bilateral 12/16/2016   Procedure: SENTINEL NODE BIOPSY;  Surgeon: Everitt Amber, MD;  Location: [facility];  Service: Gynecology;  Laterality: Bilateral;  . TONSILLECTOMY    . TUBAL LIGATION      Past Medical Hx:  Past Medical History:  Diagnosis Date  . Diabetes mellitus without complication ([place])   . Hand discomfort    right hand , raised area patient describes as painful only when typing, states " I think it may be tendinitis "; "it comes back off and on"   . HLD (hyperlipidemia)   . Hypertension     Past Gynecological History:  Endometrial cancer (stage IA grade 1 in 2018 treated with hysterectomy and SLN biopsy) No LMP recorded. Patient has had a hysterectomy.  Family Hx:  Family History  Problem Relation Age of Onset  .  Heart attack Mother   . Hypertension Mother   . Diabetes Maternal Aunt   . Diabetes Maternal Grandmother   . Ovarian cancer Neg Hx   . Breast cancer Neg Hx   . Endometrial cancer Neg Hx   . Prostate cancer Neg Hx   . Colon cancer Neg Hx   . Pancreatic cancer Neg Hx     Review of Systems:  Constitutional  Feels well,  ENT Normal appearing ears and nares bilaterally Skin/Breast  No rash, sores, jaundice, itching, dryness Cardiovascular  No chest pain, shortness of breath, or edema  Pulmonary  No cough or wheeze.  Gastro Intestinal  No nausea, vomitting, or diarrhoea. No bright red blood per rectum, no abdominal pain, change in bowel movement, or constipation.  Genito Urinary  No frequency, urgency, dysuria, no bleeding Musculo Skeletal  No myalgia, arthralgia, joint swelling or pain  Neurologic  No weakness, numbness, change in gait,  Psychology  No depression, anxiety, insomnia.   Vitals:  Blood pressure (!) 147/72, pulse 66, resp. rate 18, height $RemoveBe'5\' 5"'[id number]$  (1.651 m), weight 219 lb 14.4 oz (99.7 kg), SpO2 100 %.  Physical Exam: WD in NAD Neck  Supple NROM, without any enlargements.  Lymph Node Survey No cervical supraclavicular or inguinal adenopathy Cardiovascular  Well perfused peripheries.  Lungs  No increased WOB.  Skin  No rash/lesions/breakdown  Psychiatry  Alert and oriented to person, place, and time  Abdomen  Normoactive bowel sounds, abdomen soft, non-tender and obese without evidence of hernia.  Back No CVA tenderness Genito Urinary  Vulva/vagina: Normal external female genitalia.  No lesions. No discharge or bleeding.  Bladder/urethra:  No lesions or masses, well supported bladder  Vagina: normal mucosa, no lesions, no leukoplakia, no palpable masses, pap taken from vagina.   Cervix and uterus surgically absent. Rectal  deferred Extremities  No bilateral cyanosis, clubbing or edema.  60 minutes of total time was spent for this patient  encounter, including preparation, face-to-face counseling with the patient and coordination of care, review of imaging (results and images), communication with the referring provider and documentation of the encounter.   Thereasa Solo, MD  03/28/2020, 11:08 AM

## 2020-03-28 NOTE — Patient Instructions (Signed)
Dr Denman George saw no evidence of endometrial cancer recurrence or vaginal dysplasia on today's exam. She took a pap smear and an HPV test.   If the cells are normal and the HPV is negative, no further pap smears need to be taken and no intervention is necessary. If the cells are abnormal and the HPV is negative, this suggests changes from the lack of estrogen after removal of your ovaries, and Dr Denman George will recommend estrogen cream. If the cells are normal and the HPV is positive, this means that there is a virus living in the vagina that puts you at risk for developing vaginal cancer in the future, and Dr Denman George will recommend estrogen cream to help strengthen and defend the vaginal cells from the effects of HPV. If the cells are abnormal and the HPV is positive, Dr Denman George may recommend a treatment for the precancerous cells caused by HPV which might be estrogen or a topical chemotherapy cream for the vagina.  Her office will call you with the results and plan.  Please contact Dr Serita Grit office (at [phone]) in April to request an appointment with her for July, 2022.

## 2020-03-30 LAB — CYTOLOGY - PAP
Comment: NEGATIVE
Diagnosis: UNDETERMINED — AB
High risk HPV: POSITIVE — AB

## 2020-04-03 ENCOUNTER — Telehealth: Payer: Self-pay

## 2020-04-03 ENCOUNTER — Other Ambulatory Visit: Payer: Self-pay | Admitting: Gynecologic Oncology

## 2020-04-03 DIAGNOSIS — N893 Dysplasia of vagina, unspecified: Secondary | ICD-10-CM

## 2020-04-03 MED ORDER — ESTROGENS, CONJUGATED 0.625 MG/GM VA CREA: 1.0000 | TOPICAL_CREAM | VAGINAL | 3 refills | Status: DC

## 2020-04-03 NOTE — Telephone Encounter (Signed)
-----   Message from Everitt Amber, MD sent at 04/02/2020 11:03 AM EST ----- Regarding: recommendations My recommendation is for a course of estrogen cream 3 times per week for 4-6 months and then repeat pap smear. Thanks Terrence Dupont ----- Message ----- From: Interface, Lab In Three Zero One Sent: 03/30/2020   3:31 PM EST To: Everitt Amber, MD

## 2020-04-03 NOTE — Telephone Encounter (Signed)
Pt also stated that she has 2 pinpoint abrasions on her right labia near the edge where the hair is.  Told her  that Shelia Murray said to not shave and use scissors to trim hair as this can contribute to in grown hairs.  If the area not improved in a week, she can f/u with her gyn Shelia Murray.  Shelia Murray sent in the Premarin vaginal cream to use  3 times a week for 4-6 months.  Told Shelia Murray that July's schedule is not out yet.  She needs to call late May to schedule a follow up with Shelia Murray for a visit with a Pap smear in July.  Pt verbalized understanding.

## 2020-04-03 NOTE — Telephone Encounter (Signed)
Prescription sent

## 2020-04-06 ENCOUNTER — Telehealth: Payer: Self-pay

## 2020-04-06 NOTE — Telephone Encounter (Signed)
Told [name] that the premarin cream needed to be ordered.  It will come in this afternoon.  Her Co-pay is $90.00. She can look up on good RX etc to see if there is a discount coupon. Pt verbalized understanding.

## 2020-09-19 ENCOUNTER — Telehealth: Payer: Self-pay

## 2020-09-19 NOTE — Telephone Encounter (Signed)
Received phone call from Ms. Gwenlyn Found requesting a follow up appointment with Dr. Denman George in July. She has been scheduled for 10/03/20 at 3:15 pm. Patient verbalized understanding and is agreement of date and time.

## 2020-09-22 IMAGING — CT CT ABD-PELV W/ CM
2 of 5 series · 16 of 46 positions shown, 18 images · IV contrast (ISOVUE)
Comparison: None.

CLINICAL DATA: Abdominal pain and diarrhea for 2 days. Blood in the
stool.

EXAM:
CT ABDOMEN AND PELVIS WITH CONTRAST
TECHNIQUE: Multidetector CT imaging of the abdomen and pelvis was performed
using the standard protocol following bolus administration of
intravenous contrast.
CONTRAST:  100mL VISUTB-C77 IOPAMIDOL (VISUTB-C77) INJECTION 61%

[Series 2: axial st · axial · 0.78mm/px · z∈[+1167,+1537]mm · 13 of 86 slices shown, 15 images]
[im 6/86  soft-tissue]
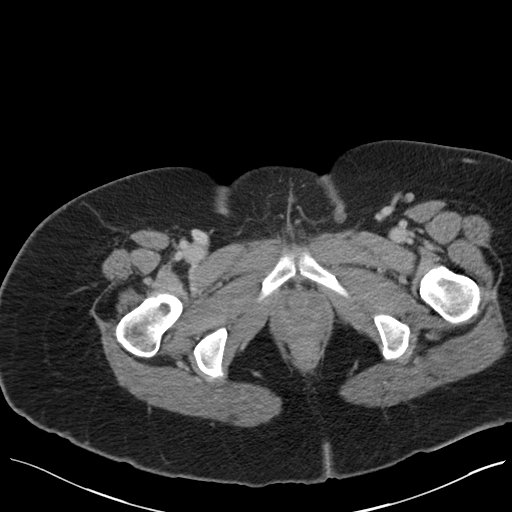
[im 6/86  bone]
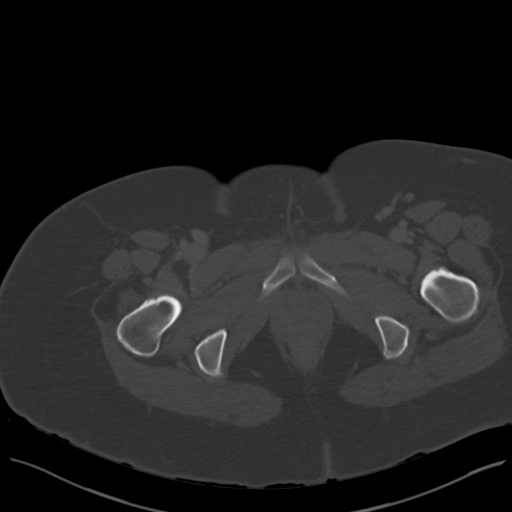
[im 11/86  soft-tissue]
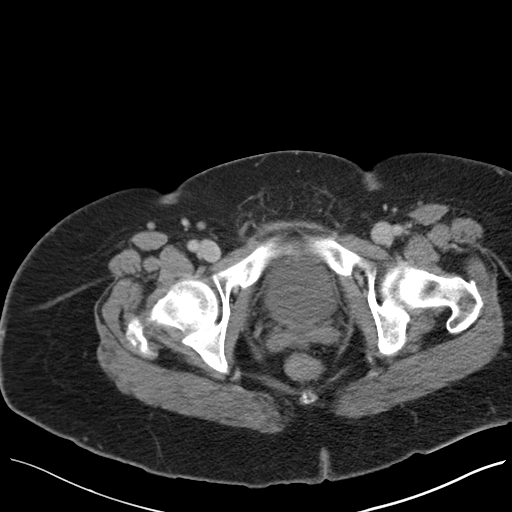
[im 16/86  soft-tissue]
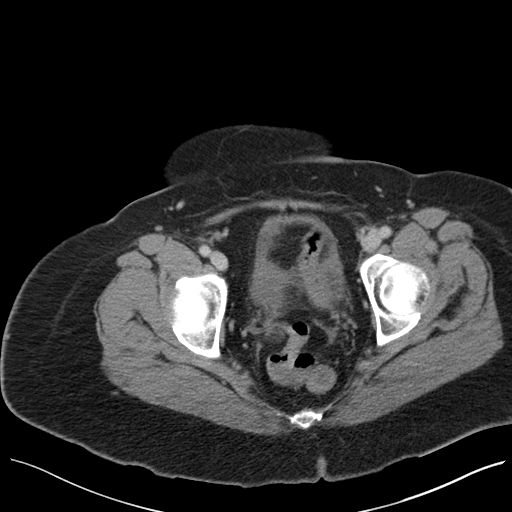
[im 27/86  soft-tissue]
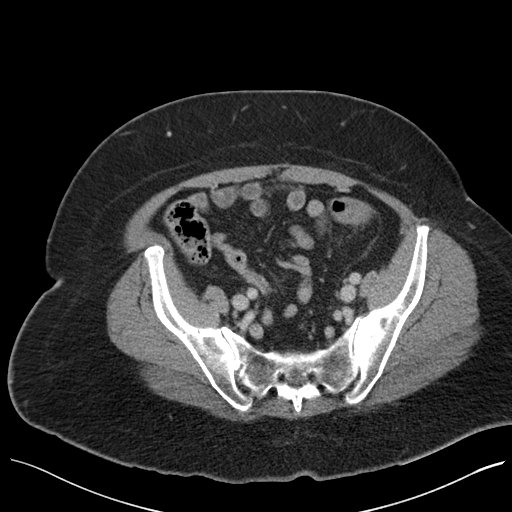
[im 32/86  soft-tissue]
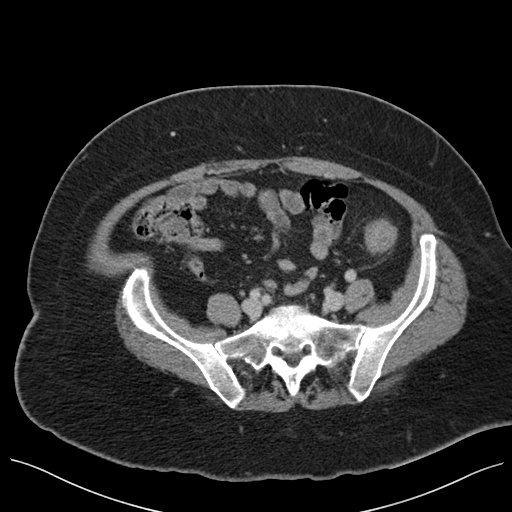
[im 38/86  soft-tissue]
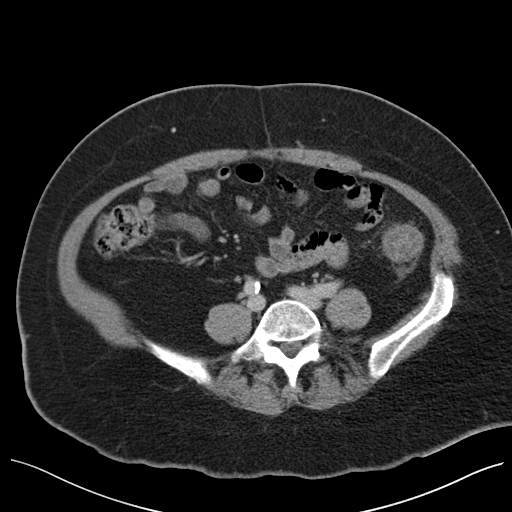
[im 43/86  soft-tissue]
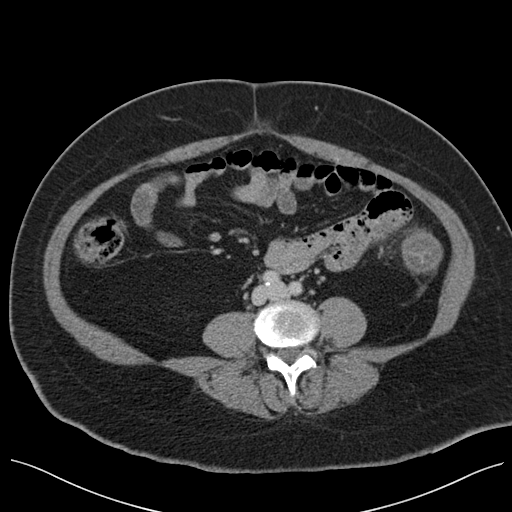
[im 48/86  soft-tissue]
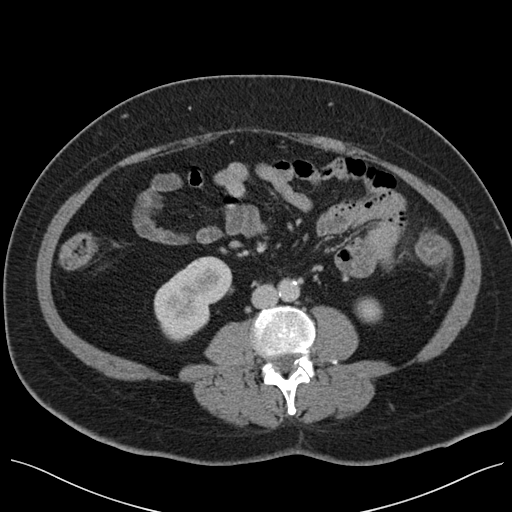
[im 54/86  soft-tissue]
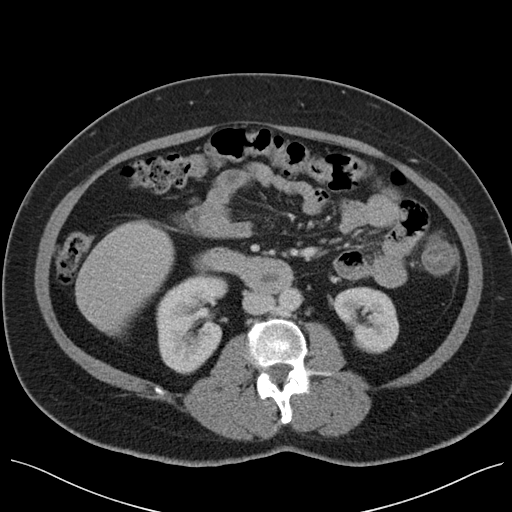
[im 54/86  bone]
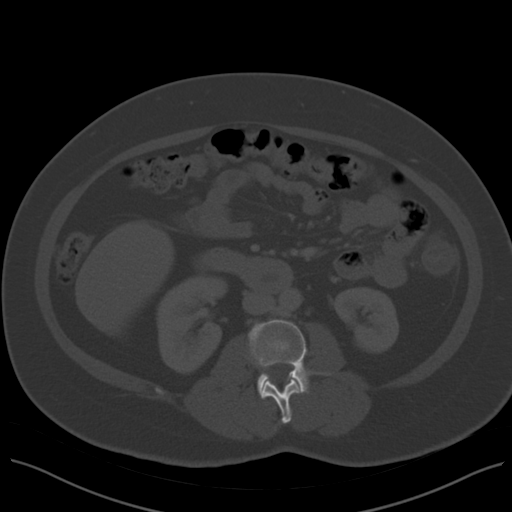
[im 59/86  soft-tissue]
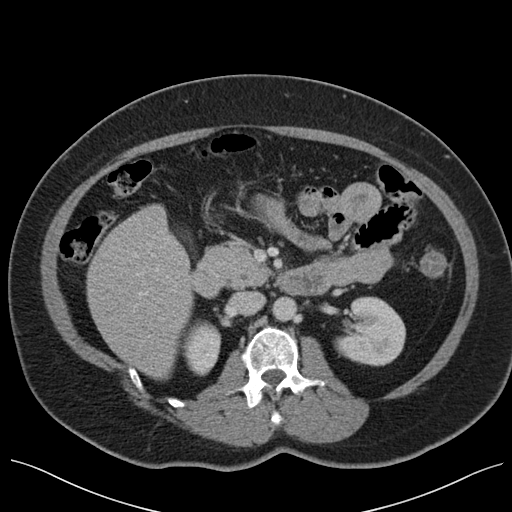
[im 70/86  soft-tissue]
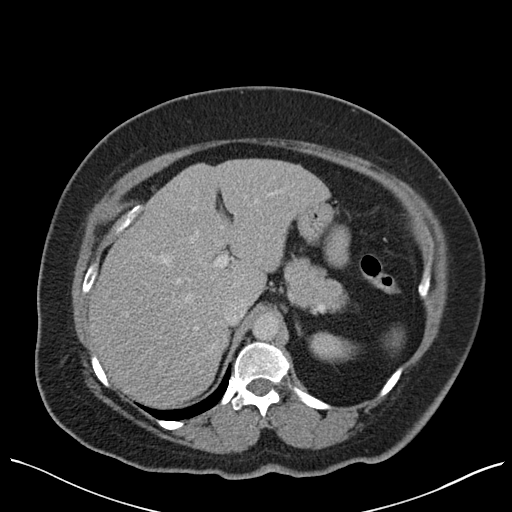
[im 75/86  soft-tissue]
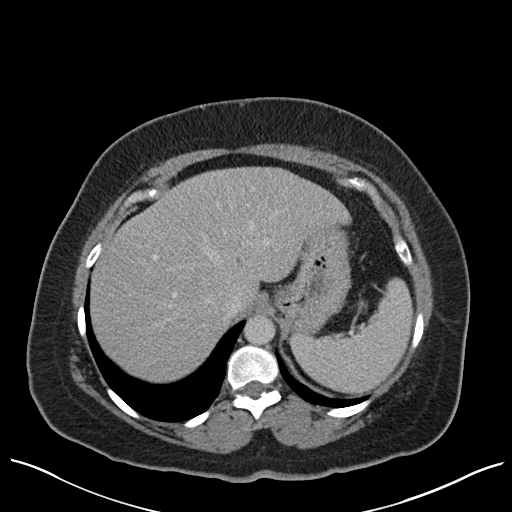
[im 80/86  soft-tissue]
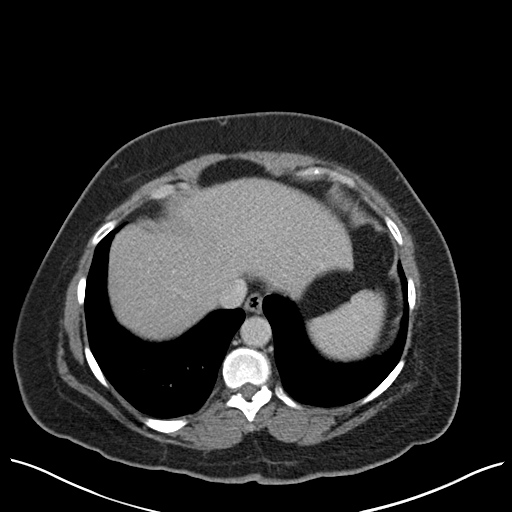

[Series 5: coronal st · coronal · 0.74mm/px · 3 of 93 slices shown]
[im 31/93  soft-tissue]
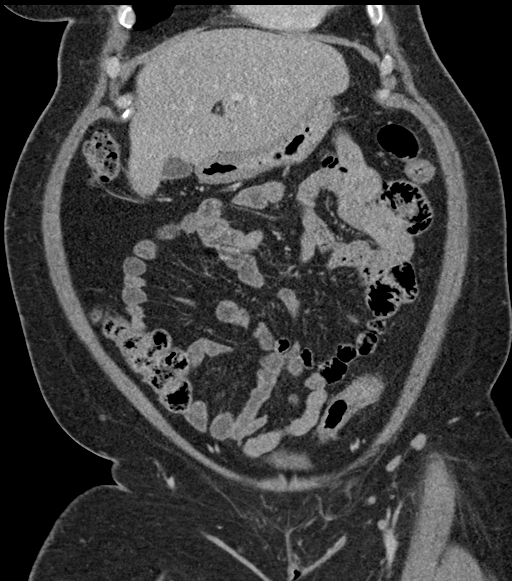
[im 41/93  soft-tissue]
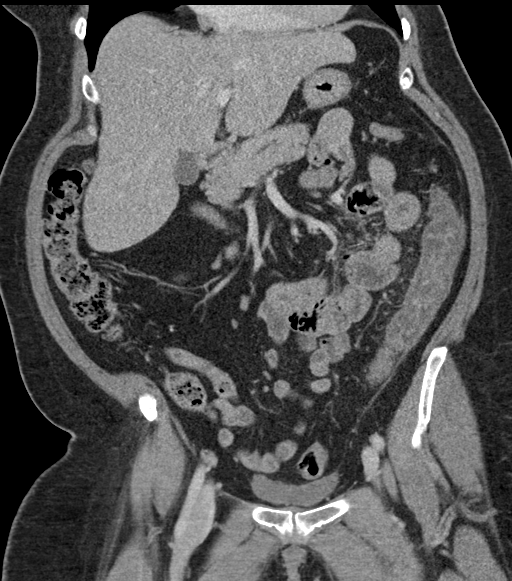
[im 52/93  soft-tissue]
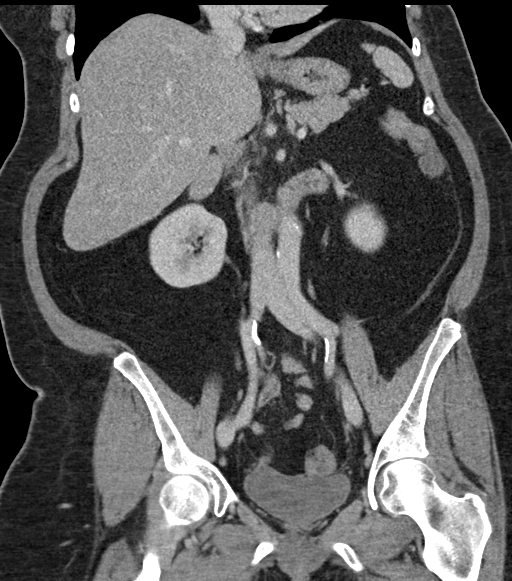

[16 of 46 positions shown; findings below may reference images not displayed]

FINDINGS: Lower chest: Mild dependent changes in the lung bases.

Hepatobiliary: Mild diffuse fatty infiltration of the liver. No
focal lesions. Gallbladder and bile ducts are unremarkable.

Pancreas: Unremarkable. No pancreatic ductal dilatation or
surrounding inflammatory changes.

Spleen: Normal in size without focal abnormality.

Adrenals/Urinary Tract: Adrenal glands are unremarkable. Kidneys are
normal, without renal calculi, focal lesion, or hydronephrosis.
Bladder is unremarkable.

Stomach/Bowel: Stomach, small bowel, and colon are not abnormally
distended. The descending colon wall is thickened with stranding
around the pericolonic fat of the descending region. This is
consistent with a localized colitis. Although few scattered
diverticula are present, the long segment of involvement makes
diverticulitis less likely. Infectious colitis or Crohn's disease
may be more likely. No abscess.

Vascular/Lymphatic: Aortic atherosclerosis. No enlarged abdominal or
pelvic lymph nodes.

Reproductive: Status post hysterectomy. No adnexal masses.

Other: No free air or free fluid in the abdomen. Minimal
periumbilical hernia containing fat.

Musculoskeletal: No acute or significant osseous findings.
IMPRESSION: Wall thickening and pericolonic infiltration involving the
descending colon consistent with localized colitis. Consider
infectious colitis or Crohn's disease. Diverticulitis less likely
due to length of involvement. No abscess. Mild diffuse fatty
infiltration of the liver. Aortic atherosclerosis.

## 2020-10-03 ENCOUNTER — Inpatient Hospital Stay: Payer: BC Managed Care – PPO | Attending: Gynecologic Oncology | Admitting: Gynecologic Oncology

## 2020-10-03 ENCOUNTER — Encounter: Payer: Self-pay | Admitting: Gynecologic Oncology

## 2020-10-03 ENCOUNTER — Other Ambulatory Visit: Payer: Self-pay

## 2020-10-03 VITALS — BP 148/65 | HR 70 | Temp 97.8°F | Resp 18 | Ht 65.0 in | Wt 231.9 lb

## 2020-10-03 DIAGNOSIS — Z8542 Personal history of malignant neoplasm of other parts of uterus: Secondary | ICD-10-CM | POA: Diagnosis not present

## 2020-10-03 DIAGNOSIS — Z87891 Personal history of nicotine dependence: Secondary | ICD-10-CM | POA: Diagnosis not present

## 2020-10-03 DIAGNOSIS — Z780 Asymptomatic menopausal state: Secondary | ICD-10-CM | POA: Diagnosis not present

## 2020-10-03 DIAGNOSIS — I10 Essential (primary) hypertension: Secondary | ICD-10-CM | POA: Insufficient documentation

## 2020-10-03 DIAGNOSIS — R87810 Cervical high risk human papillomavirus (HPV) DNA test positive: Secondary | ICD-10-CM | POA: Insufficient documentation

## 2020-10-03 DIAGNOSIS — N893 Dysplasia of vagina, unspecified: Secondary | ICD-10-CM

## 2020-10-03 DIAGNOSIS — Z79890 Hormone replacement therapy (postmenopausal): Secondary | ICD-10-CM | POA: Insufficient documentation

## 2020-10-03 DIAGNOSIS — Z90722 Acquired absence of ovaries, bilateral: Secondary | ICD-10-CM | POA: Insufficient documentation

## 2020-10-03 DIAGNOSIS — Z90710 Acquired absence of both cervix and uterus: Secondary | ICD-10-CM | POA: Insufficient documentation

## 2020-10-03 DIAGNOSIS — E785 Hyperlipidemia, unspecified: Secondary | ICD-10-CM | POA: Diagnosis not present

## 2020-10-03 DIAGNOSIS — E119 Type 2 diabetes mellitus without complications: Secondary | ICD-10-CM | POA: Diagnosis not present

## 2020-10-03 DIAGNOSIS — Z79899 Other long term (current) drug therapy: Secondary | ICD-10-CM | POA: Diagnosis not present

## 2020-10-03 MED ORDER — ESTROGENS, CONJUGATED 0.625 MG/GM VA CREA: 1.0000 | TOPICAL_CREAM | VAGINAL | 3 refills | Status: AC

## 2020-10-03 NOTE — Progress Notes (Signed)
Follow-up Note: Gyn-Onc  Consult was requested by Dr. Billy Coast for the evaluation of Shelia Murray 60 y.o. female  CC:  Chief Complaint  Patient presents with   History of endometrial cancer   Vaginal dysplasia     Assessment/Plan:  Shelia Murray  is a 60 y.o.  year old with a history of an LGSIL pap after a prior hysterectomy for endometrial cancer, MMR not performed due to inadequate tumor for analysis.  (stage IA grade 1 endometrioid endometrial cancer in October, 2018). High risk HPV positive and ASCUS paps.   No evidence of recurrent malignancy on exam.  She has a history of ASCUS with positive high risk HPV in January, 2021. Recommend repeat pap in January, 2022 (with HPV cotesting). Recommend estrogen use vaginally in the interim as this can help with clearance of vaginal dysplasia.   She will return in 6 months for routine endometrial cancer surveillance and repeat pap with HPV. She will follow-up with my partner, Dr Tamela Oddi as I am leaving the practice in October.   HPI: Shelia Murray is a 60 year old woman with a history of stage IA grade 1 endometrioid endometrial cancer in 2018 who was seen in consultation at the request of Dr Billy Coast for evaluation of LGSIL on a pap.  The patient was known to me from her diagnosis of endometrial cancer in 2018.  At that time she had some abnormal bleeding symptoms but essentially her cancer was diagnosed after the finding of an AGUS Pap smear in 2018.  HPV testing was not performed on that Pap.  But it treated and work-up with an ultrasound that revealed a thickened endometrial stripe, and an endometrial biopsy which confirmed a grade 1 endometrial cancer.  At the time of diagnosis she reported to me that she she had last had an abnormal Pap smear possibly at age 21 or 39, with no subsequent therapy recommended, and no abnormal Pap smears since that time prior to her AGUS Pap smear in 2018.  She subsequently underwent a  robotic assisted total hysterectomy with BSO and sentinel lymph node biopsy on 12/16/2016.  Final pathology from that surgery revealed no residual carcinoma in the specimen with negative nodes.  The cervix was noted to be histologically normal with no dysplasia or malignancy.  She met low risk criteria for recurrence in accordance with NCCN guidelines no adjuvant therapy was prescribed.  She subsequently followed up with Dr. Billy Coast and underwent a Pap smear in October, 2020 that revealed LG SIL changes negative for high-risk HPV.  I presume this Pap smear was performed due to her history of an AGUS Pap smear in 2018.  Following her LGSIL Pap and 2020 she underwent a repeat Pap on 12/28/2019.  High risk HPV cotesting was not performed as it was ordered as "reflex" and reflex criteria were not met.  This 2021 Pap revealed low-grade squamous intraepithelial lesion.  To follow-up the LGSIL Pap that she had had in 2020 and 2021 she underwent vaginal colposcopy with biopsy on 02/01/20.  Notes from the examination revealed a normal-appearing vaginal wall.  Biopsy was collected from the vaginal cuff presumably and revealed rare squamous epithelium that was benign but insufficient for diagnosis.  Interval Hx:  Repeat pap with HPV co-testing on 03/28/20 showed ASCUS, positive for high risk HPV. We recommended vaginal estrogen and repeat testing in 12 months.     Current Meds:  Outpatient Encounter Medications as of 10/03/2020  Medication Sig  ammonium lactate (LAC-HYDRIN) 12 % lotion Apply to dark areas on thighs 1-2 times daily (Patient not taking: Reported on 03/27/2020)   clindamycin (CLEOCIN T) 1 % external solution Apply to groin daily as needed for bumps. (Patient not taking: Reported on 03/27/2020)   Clindamycin-Benzoyl Per, Refr, gel Apply topically every morning.   clobetasol ointment (TEMOVATE) 0.05 % Apply to scalp 3-4x weekly. Never to face. (Patient not taking: Reported on 03/27/2020)   conjugated  estrogens (PREMARIN) vaginal cream Place 1 Applicatorful vaginally 3 (three) times a week.   cyclobenzaprine (FLEXERIL) 10 MG tablet Take 10 mg by mouth 3 (three) times daily as needed for muscle spasms. (Patient not taking: Reported on 03/27/2020)   furosemide (LASIX) 20 MG tablet Take 20 mg by mouth daily as needed for fluid.   ibuprofen (ADVIL,MOTRIN) 800 MG tablet TAKE 1 TABLET BY MOUTH TWICE DAILY AS NEEDED FOR PAIN   lisinopril (PRINIVIL,ZESTRIL) 20 MG tablet TAKE 1 TABLET BY MOUTH DAILY   Microlet Lancets MISC See admin instructions.   pravastatin (PRAVACHOL) 40 MG tablet TAKE 1 TABLET BY MOUTH IN THE EVENING   TRULICITY 1.5 MG/0.5ML SOPN 0.5 mLs.    VESICARE 5 MG tablet TAKE 1 TABLET BY MOUTH IN THE EVENING   No facility-administered encounter medications on file as of 10/03/2020.    Allergy:  Allergies  Allergen Reactions   Ketamine Itching and Other (See Comments)    "crazy", Confusion.    Metformin And Related Diarrhea   Amoxicillin Nausea And Vomiting    Pt states it was a high dose of Amoxicillin    Social Hx:   Social History   Socioeconomic History   Marital status: Married    Spouse name: Not on file   Number of children: Not on file   Years of education: Not on file   Highest education level: Not on file  Occupational History   Not on file  Tobacco Use   Smoking status: Former    Packs/day: 0.25    Years: 20.00    Pack years: 5.00    Types: Cigarettes    Quit date: 12/03/2001    Years since quitting: 18.8   Smokeless tobacco: Never  Vaping Use   Vaping Use: Never used  Substance and Sexual Activity   Alcohol use: No   Drug use: No   Sexual activity: Not Currently  Other Topics Concern   Not on file  Social History Narrative   Not on file   Social Determinants of Health   Financial Resource Strain: Not on file  Food Insecurity: Not on file  Transportation Needs: Not on file  Physical Activity: Not on file  Stress: Not on file  Social  Connections: Not on file  Intimate Partner Violence: Not on file    Past Surgical Hx:  Past Surgical History:  Procedure Laterality Date   CESAREAN SECTION     CYST EXCISION     left anterior torso    LIPOMA EXCISION     removed from back    LUNG SURGERY Left 2003   Resection of lung nodules LLL   ROBOTIC ASSISTED TOTAL HYSTERECTOMY WITH BILATERAL SALPINGO OOPHERECTOMY Bilateral 12/16/2016   Procedure: ROBOTIC ASSISTED TOTAL HYSTERECTOMY WITH BILATERAL SALPINGO OOPHORECTOMY;  Surgeon: Adolphus Birchwood, MD;  Location: [facility];  Service: Gynecology;  Laterality: Bilateral;   SENTINEL NODE BIOPSY Bilateral 12/16/2016   Procedure: SENTINEL NODE BIOPSY;  Surgeon: Adolphus Birchwood, MD;  Location: [facility];  Service: Gynecology;  Laterality: Bilateral;  TONSILLECTOMY     TUBAL LIGATION      Past Medical Hx:  Past Medical History:  Diagnosis Date   Diabetes mellitus without complication ([place])    Hand discomfort    right hand , raised area patient describes as painful only when typing, states " I think it may be tendinitis "; "it comes back off and on"    HLD (hyperlipidemia)    Hypertension     Past Gynecological History:  Endometrial cancer (stage IA grade 1 in 2018 treated with hysterectomy and SLN biopsy) No LMP recorded. Patient has had a hysterectomy.  Family Hx:  Family History  Problem Relation Age of Onset   Heart attack Mother    Hypertension Mother    Diabetes Maternal Aunt    Diabetes Maternal Grandmother    Ovarian cancer Neg Hx    Breast cancer Neg Hx    Endometrial cancer Neg Hx    Prostate cancer Neg Hx    Colon cancer Neg Hx    Pancreatic cancer Neg Hx     Review of Systems:  Constitutional  Feels well,    ENT Normal appearing ears and nares bilaterally Skin/Breast  No rash, sores, jaundice, itching, dryness Cardiovascular  No chest pain, shortness of breath, or edema  Pulmonary  No cough or wheeze.  Gastro Intestinal  No nausea, vomitting, or diarrhoea. No  bright red blood per rectum, no abdominal pain, change in bowel movement, or constipation.  Genito Urinary  No frequency, urgency, dysuria, no bleeding Musculo Skeletal  No myalgia, arthralgia, joint swelling or pain  Neurologic  No weakness, numbness, change in gait,  Psychology  No depression, anxiety, insomnia.   Vitals:  Blood pressure (!) 148/65, pulse 70, temperature 97.8 F (36.6 C), temperature source Tympanic, resp. rate 18, height $RemoveBe'5\' 5"'[id number]$  (1.651 m), weight 231 lb 14.4 oz (105.2 kg), SpO2 98 %.  Physical Exam: WD in NAD Neck  Supple NROM, without any enlargements.  Lymph Node Survey No cervical supraclavicular or inguinal adenopathy Cardiovascular  Well perfused peripheries.  Lungs  No increased WOB.  Skin  No rash/lesions/breakdown  Psychiatry  Alert and oriented to person, place, and time  Abdomen  Normoactive bowel sounds, abdomen soft, non-tender and obese without evidence of hernia.  Back No CVA tenderness Genito Urinary  Vulva/vagina: Normal external female genitalia.  No lesions. No discharge or bleeding.  Bladder/urethra:  No lesions or masses, well supported bladder  Vagina: normal mucosa, no lesions, no leukoplakia, no palpable masses.   Cervix and uterus surgically absent. Rectal  deferred Extremities  No bilateral cyanosis, clubbing or edema.    Thereasa Solo, MD  10/03/2020, 3:35 PM

## 2020-10-03 NOTE — Patient Instructions (Signed)
Dr Denman George recommends returning for a follow-up and pap smear in 6 months with Dr Delsa Sale. A pap smear will be performed at that visit.  After this visit, you can follow-up with DrTaavon for annual visits to monitor for the vaginal dysplasia.

## 2021-01-16 ENCOUNTER — Other Ambulatory Visit: Payer: Self-pay | Admitting: Obstetrics and Gynecology

## 2021-01-16 DIAGNOSIS — N959 Unspecified menopausal and perimenopausal disorder: Secondary | ICD-10-CM

## 2021-02-21 ENCOUNTER — Ambulatory Visit
Admission: RE | Admit: 2021-02-21 | Discharge: 2021-02-21 | Disposition: A | Discharge status: Home / Self Care | Payer: BC Managed Care – PPO | Source: Ambulatory Visit | Attending: Obstetrics and Gynecology | Admitting: Obstetrics and Gynecology

## 2021-02-21 ENCOUNTER — Other Ambulatory Visit: Payer: Self-pay

## 2021-02-21 DIAGNOSIS — N959 Unspecified menopausal and perimenopausal disorder: Secondary | ICD-10-CM

## 2022-01-23 ENCOUNTER — Ambulatory Visit
Admission: RE | Admit: 2022-01-23 | Discharge: 2022-01-23 | Disposition: A | Discharge status: Home / Self Care | Payer: BC Managed Care – PPO | Source: Ambulatory Visit | Attending: Family Medicine | Admitting: Family Medicine

## 2022-01-23 ENCOUNTER — Other Ambulatory Visit: Payer: Self-pay | Admitting: Family Medicine

## 2022-01-23 DIAGNOSIS — R058 Other specified cough: Secondary | ICD-10-CM

## 2022-11-04 ENCOUNTER — Encounter: Payer: Self-pay | Admitting: Obstetrics and Gynecology

## 2023-04-11 ENCOUNTER — Encounter: Payer: Self-pay | Admitting: *Deleted

## 2023-04-11 ENCOUNTER — Ambulatory Visit
Admission: EM | Admit: 2023-04-11 | Discharge: 2023-04-11 | Disposition: A | Discharge status: Home / Self Care | Payer: No Typology Code available for payment source | Attending: Family Medicine | Admitting: Family Medicine

## 2023-04-11 ENCOUNTER — Other Ambulatory Visit: Payer: Self-pay

## 2023-04-11 DIAGNOSIS — N898 Other specified noninflammatory disorders of vagina: Secondary | ICD-10-CM | POA: Insufficient documentation

## 2023-04-11 DIAGNOSIS — N3090 Cystitis, unspecified without hematuria: Secondary | ICD-10-CM | POA: Diagnosis present

## 2023-04-11 HISTORY — DX: Anxiety disorder, unspecified: F41.9

## 2023-04-11 LAB — POCT URINALYSIS DIP (MANUAL ENTRY)
Bilirubin, UA: NEGATIVE
Glucose, UA: 500 mg/dL — AB
Ketones, POC UA: NEGATIVE mg/dL
Nitrite, UA: NEGATIVE
Protein Ur, POC: 30 mg/dL — AB
Spec Grav, UA: 1.02 (ref 1.010–1.025)
Urobilinogen, UA: 0.2 U/dL
pH, UA: 6 (ref 5.0–8.0)

## 2023-04-11 MED ORDER — CEPHALEXIN 500 MG PO CAPS: 500.0000 mg | ORAL_CAPSULE | Freq: Two times a day (BID) | ORAL | 0 refills | Status: AC

## 2023-04-11 MED ORDER — FLUCONAZOLE 150 MG PO TABS: ORAL_TABLET | ORAL | 0 refills | Status: AC

## 2023-04-11 NOTE — ED Provider Notes (Signed)
[facility]   [id number] 04/11/23 Arrival Time: 1039  ASSESSMENT & PLAN:  1. Cystitis   2. Vaginal irritation    Begin: Meds ordered this encounter  Medications   cephALEXin (KEFLEX) 500 MG capsule    Sig: Take 1 capsule (500 mg total) by mouth 2 (two) times daily.    Dispense:  10 capsule    Refill:  0   fluconazole (DIFLUCAN) 150 MG tablet    Sig: Take one tablet by mouth as a single dose. May repeat in 3 days if symptoms persist.    Dispense:  2 tablet    Refill:  0   Vaginal cytology pending.   Discharge Instructions      You have had labs (urine culture and vaginal swab) sent today. We will call you with any significant abnormalities or if there is need to begin or change treatment or pursue further follow up.  You may also review your test results online through MyChart. If you do not have a MyChart account, instructions to sign up should be on your discharge paperwork.     Labs Reviewed  POCT URINALYSIS DIP (MANUAL ENTRY) - Abnormal; Notable for the following components:      Result Value   Clarity, UA cloudy (*)    Glucose, UA =500 (*)    Blood, UA large (*)    Protein Ur, POC =30 (*)    Leukocytes, UA Trace (*)    All other components within normal limits  CERVICOVAGINAL ANCILLARY ONLY   Will notify of any positive results. Instructed to refrain from sexual activity for at least seven days.  Reviewed expectations re: course of current medical issues. Questions answered. Outlined signs and symptoms indicating need for more acute intervention. Patient verbalized understanding. After Visit Summary given.   SUBJECTIVE:  Shelia Murray is a 63 y.o. female who presents with complaint of urinary freq with "drops of blood"; noted over past week. Mild vaginal irritation. Ques yeast infection. Denies fever/n/v/abd pain.  No LMP recorded. Patient has had a hysterectomy.   OBJECTIVE:  Vitals:   04/11/23 1153  BP: 128/75  Pulse: 60  Resp:  18  Temp: 97.8 F (36.6 C)  TempSrc: Oral  SpO2: 98%     General appearance: alert, cooperative, appears stated age and no distress Lungs: unlabored respirations; speaks full sentences without difficulty GU: deferred Skin: warm and dry Psychological: alert and cooperative; normal mood and affect.  Results for orders placed or performed during the hospital encounter of 04/11/23  POCT urinalysis dipstick   Collection Time: 04/11/23 12:07 PM  Result Value Ref Range   Color, UA yellow yellow   Clarity, UA cloudy (A) clear   Glucose, UA =500 (A) negative mg/dL   Bilirubin, UA negative negative   Ketones, POC UA negative negative mg/dL   Spec Grav, UA 4.098 1.191 - 1.025   Blood, UA large (A) negative   pH, UA 6.0 5.0 - 8.0   Protein Ur, POC =30 (A) negative mg/dL   Urobilinogen, UA 0.2 0.2 or 1.0 E.U./dL   Nitrite, UA Negative Negative   Leukocytes, UA Trace (A) Negative    Labs Reviewed  POCT URINALYSIS DIP (MANUAL ENTRY) - Abnormal; Notable for the following components:      Result Value   Clarity, UA cloudy (*)    Glucose, UA =500 (*)    Blood, UA large (*)    Protein Ur, POC =30 (*)    Leukocytes, UA Trace (*)  All other components within normal limits  CERVICOVAGINAL ANCILLARY ONLY    Allergies  Allergen Reactions   Ketamine Itching and Other (See Comments)    "crazy", Confusion.    Metformin And Related Diarrhea   Amoxicillin Nausea And Vomiting    Pt states it was a high dose of Amoxicillin    Past Medical History:  Diagnosis Date   Anxiety    Diabetes mellitus without complication ([facility])    Hand discomfort    right hand , raised area patient describes as painful only when typing, states " I think it may be tendinitis "; "it comes back off and on"    HLD (hyperlipidemia)    Hypertension    Family History  Problem Relation Age of Onset   Heart attack Mother    Hypertension Mother    Diabetes Maternal Grandmother    Diabetes Maternal Aunt     Ovarian cancer Neg Hx    Breast cancer Neg Hx    Endometrial cancer Neg Hx    Prostate cancer Neg Hx    Colon cancer Neg Hx    Pancreatic cancer Neg Hx    Social History   Socioeconomic History   Marital status: Married    Spouse name: Not on file   Number of children: Not on file   Years of education: Not on file   Highest education level: Not on file  Occupational History   Not on file  Tobacco Use   Smoking status: Former    Current packs/day: 0.00    Average packs/day: 0.3 packs/day for 20.0 years (5.0 ttl pk-yrs)    Types: Cigarettes    Start date: 12/03/1981    Quit date: 12/03/2001    Years since quitting: 21.3   Smokeless tobacco: Never  Vaping Use   Vaping status: Never Used  Substance and Sexual Activity   Alcohol use: No   Drug use: No   Sexual activity: Not Currently  Other Topics Concern   Not on file  Social History Narrative   Not on file   Social Drivers of Health   Financial Resource Strain: Not on file  Food Insecurity: Not on file  Transportation Needs: Not on file  Physical Activity: Not on file  Stress: Not on file (01/21/2023)  Social Connections: Not on file  Intimate Partner Violence: Low Risk  (07/01/2019)   Received from [facility]   Intimate Partner Violence    Insults You: Not on file    Threatens You: Not on file    Screams at You: Not on file    Physically Hurt: Not on file    Intimate Partner Violence Score: Not on file           Mardella Layman, MD 04/11/23 1326

## 2023-04-11 NOTE — ED Triage Notes (Signed)
Urinary frequency, cloudy urine and noticing "drops of blood" over the last week. Also was on antibiotics in Dec and has been having some discharge she thinks may be yeast

## 2023-04-11 NOTE — Discharge Instructions (Signed)
You have had labs (urine culture and vaginal swab) sent today. We will call you with any significant abnormalities or if there is need to begin or change treatment or pursue further follow up.  You may also review your test results online through MyChart. If you do not have a MyChart account, instructions to sign up should be on your discharge paperwork.

## 2023-04-13 LAB — CERVICOVAGINAL ANCILLARY ONLY
Bacterial Vaginitis (gardnerella): NEGATIVE
Candida Glabrata: POSITIVE — AB
Candida Vaginitis: NEGATIVE
Chlamydia: NEGATIVE
Comment: NEGATIVE
Comment: NEGATIVE
Comment: NEGATIVE
Comment: NEGATIVE
Comment: NEGATIVE
Comment: NORMAL
Neisseria Gonorrhea: NEGATIVE
Trichomonas: NEGATIVE

## 2023-06-17 ENCOUNTER — Other Ambulatory Visit: Payer: Self-pay | Admitting: Obstetrics and Gynecology

## 2023-06-17 DIAGNOSIS — N951 Menopausal and female climacteric states: Secondary | ICD-10-CM

## 2023-06-19 ENCOUNTER — Other Ambulatory Visit: Payer: Self-pay | Admitting: Obstetrics and Gynecology

## 2023-06-19 DIAGNOSIS — E2839 Other primary ovarian failure: Secondary | ICD-10-CM

## 2023-06-20 ENCOUNTER — Ambulatory Visit
Admission: RE | Admit: 2023-06-20 | Discharge: 2023-06-20 | Disposition: A | Discharge status: Home / Self Care | Source: Ambulatory Visit | Attending: Obstetrics and Gynecology | Admitting: Obstetrics and Gynecology

## 2023-06-20 DIAGNOSIS — E2839 Other primary ovarian failure: Secondary | ICD-10-CM

## 2024-02-17 ENCOUNTER — Emergency Department (HOSPITAL_BASED_OUTPATIENT_CLINIC_OR_DEPARTMENT_OTHER)
Admission: EM | Admit: 2024-02-17 | Discharge: 2024-02-17 | Disposition: A | Discharge status: Home / Self Care | Source: Ambulatory Visit | Attending: Emergency Medicine | Admitting: Emergency Medicine

## 2024-02-17 ENCOUNTER — Emergency Department (HOSPITAL_BASED_OUTPATIENT_CLINIC_OR_DEPARTMENT_OTHER): Admitting: Radiology

## 2024-02-17 ENCOUNTER — Other Ambulatory Visit: Payer: Self-pay

## 2024-02-17 ENCOUNTER — Emergency Department (HOSPITAL_BASED_OUTPATIENT_CLINIC_OR_DEPARTMENT_OTHER)

## 2024-02-17 ENCOUNTER — Encounter (HOSPITAL_BASED_OUTPATIENT_CLINIC_OR_DEPARTMENT_OTHER): Payer: Self-pay | Admitting: Emergency Medicine

## 2024-02-17 DIAGNOSIS — W01198A Fall on same level from slipping, tripping and stumbling with subsequent striking against other object, initial encounter: Secondary | ICD-10-CM | POA: Insufficient documentation

## 2024-02-17 DIAGNOSIS — S20211A Contusion of right front wall of thorax, initial encounter: Secondary | ICD-10-CM | POA: Insufficient documentation

## 2024-02-17 DIAGNOSIS — S0990XA Unspecified injury of head, initial encounter: Secondary | ICD-10-CM | POA: Diagnosis not present

## 2024-02-17 NOTE — Discharge Instructions (Signed)
 Please rest and drink plenty of fluids.  Follow-up with your primary care doctor.  Return to the emergency department if any worsening or concerning symptoms

## 2024-02-17 NOTE — ED Provider Notes (Signed)
 [place] [facility] AT [facility] Provider Note   CSN: [id number] Arrival date & time: 02/17/24  1421     Patient presents with: Shelia Murray is a 63 y.o. female.  She had a mechanical fall about 5 days ago stepping off a curb.  Hit the back of her head struck her chest wall.  Since then she has had some discomfort in her right chest especially with movement and deep breathing.  She is also pain in the back of her head.  She feels more fatigued than normal.  She has had a couple of other falls since then which is unusual for her.  Went to urgent care yesterday where they did a chest x-ray.  They recommended she come to get further imaging of her head.  She is not on any blood thinners.  {Add pertinent medical, surgical, social history, OB history to YEP:67052} The history is provided by the patient.  Head Injury Location:  Occipital Time since incident:  5 days Mechanism of injury: fall   Fall:    Fall occurred:  Walking Pain details:    Quality:  Aching   Progression:  Improving Chronicity:  New Associated symptoms: neck pain   Associated symptoms: no difficulty breathing, no focal weakness, no seizures and no vomiting        Prior to Admission medications   Medication Sig Start Date End Date Taking? Authorizing Provider  cephALEXin  (KEFLEX ) 500 MG capsule Take 1 capsule (500 mg total) by mouth 2 (two) times daily. 04/11/23   Rolinda Rogue, MD  Clindamycin-Benzoyl Per, Refr, gel Apply topically every morning. 01/14/20   [provider]  conjugated estrogens  (PREMARIN ) vaginal cream Place 1 Applicatorful vaginally 3 (three) times a week. 10/03/20   Eloy Herring, MD  fluconazole  (DIFLUCAN ) 150 MG tablet Take one tablet by mouth as a single dose. May repeat in 3 days if symptoms persist. 04/11/23   Rolinda Rogue, MD  furosemide (LASIX) 20 MG tablet Take 20 mg by mouth daily as needed for fluid.    [provider]  ibuprofen   (ADVIL ,MOTRIN ) 800 MG tablet TAKE 1 TABLET BY MOUTH TWICE DAILY AS NEEDED FOR PAIN 11/04/16   [provider]  lisinopril  (PRINIVIL ,ZESTRIL ) 20 MG tablet TAKE 1 TABLET BY MOUTH DAILY 09/27/16   [provider]  Microlet Lancets MISC See admin instructions.    [provider]  pravastatin  (PRAVACHOL ) 40 MG tablet TAKE 1 TABLET BY MOUTH IN THE EVENING 09/18/16   [provider]  TRULICITY 1.5 MG/0.5ML SOPN 0.5 mLs.  11/12/16   [provider]  VESICARE 5 MG tablet TAKE 1 TABLET BY MOUTH IN THE EVENING 11/25/16   [provider]    Allergies: Ketamine, Metformin and related, and Amoxicillin    Review of Systems  Constitutional:  Negative for fever.  Respiratory:  Negative for shortness of breath.   Cardiovascular:  Positive for chest pain.  Gastrointestinal:  Negative for abdominal pain and vomiting.  Musculoskeletal:  Positive for neck pain.  Neurological:  Negative for focal weakness and seizures.    Updated Vital Signs BP (!) 169/70 (BP Location: Right Arm)   Pulse 69   Temp 98.7 F (37.1 C) (Oral)   Resp 18   SpO2 99%   Physical Exam Vitals and nursing note reviewed.  Constitutional:      General: She is not in acute distress.    Appearance: Normal appearance. She is well-developed.  HENT:  Head: Normocephalic and atraumatic.  Eyes:     Conjunctiva/sclera: Conjunctivae normal.  Neck:     Comments: She has some mild tenderness right occipital condyle into her right paraspinal neck.  No crepitus no bruising Cardiovascular:     Rate and Rhythm: Normal rate and regular rhythm.     Heart sounds: No murmur heard. Pulmonary:     Effort: Pulmonary effort is normal. No respiratory distress.     Breath sounds: Normal breath sounds.  Abdominal:     Palpations: Abdomen is soft.     Tenderness: There is no abdominal tenderness. There is no guarding or rebound.  Musculoskeletal:        General: Swelling present. No deformity.      Cervical back: Neck supple. Tenderness present.  Skin:    General: Skin is warm and dry.     Capillary Refill: Capillary refill takes less than 2 seconds.  Neurological:     General: No focal deficit present.     Mental Status: She is alert and oriented to person, place, and time.     Sensory: No sensory deficit.     Motor: No weakness.     (all labs ordered are listed, but only abnormal results are displayed) Labs Reviewed - No data to display  EKG: None  Radiology: No results found.  {Document cardiac monitor, telemetry assessment procedure when appropriate:32947} Procedures   Medications Ordered in the ED - No data to display    {Click here for ABCD2, HEART and other calculators REFRESH Note before signing:1}                              Medical Decision Making Amount and/or Complexity of Data Reviewed Radiology: ordered.   This patient complains of ***; this involves an extensive number of treatment Options and is a complaint that carries with it a high risk of complications and morbidity. The differential includes ***  I ordered, reviewed and interpreted labs, which included *** I ordered medication *** and reviewed PMP when indicated. I ordered imaging studies which included *** and I independently    visualized and interpreted imaging which showed *** Additional history obtained from *** Previous records obtained and reviewed *** I consulted *** and discussed lab and imaging findings and discussed disposition.  Cardiac monitoring reviewed, *** Social determinants considered, *** Critical Interventions: ***  After the interventions stated above, I reevaluated the patient and found *** Admission and further testing considered, ***   {Document critical care time when appropriate  Document review of labs and clinical decision tools ie CHADS2VASC2, etc  Document your independent review of radiology images and any outside records  Document your discussion with  family members, caretakers and with consultants  Document social determinants of health affecting pt's care  Document your decision making why or why not admission, treatments were needed:32947:::1}   Final diagnoses:  None    ED Discharge Orders     None

## 2024-02-17 NOTE — ED Triage Notes (Signed)
 Reports mechanical fall on Friday. Reports headache and R rib pain. States got xray done at PCP and was told xray was negative.

## 2024-02-17 NOTE — ED Notes (Signed)
 Pt d/c instructions, medications, and follow-up care reviewed with pt. Pt verbalized understanding and had no further questions at time of d/c. Pt CA&Ox4, ambulatory, and in NAD at time of d/c
